# Patient Record
Sex: Male | Born: 1945 | Race: White | Hispanic: No | Marital: Single | Smoking: Never smoker
Health system: Southern US, Community
[De-identification: ages and names within clinical notes are randomized; demographics above are authoritative.]

## PROBLEM LIST (undated history)

## (undated) DIAGNOSIS — M1990 Unspecified osteoarthritis, unspecified site: Secondary | ICD-10-CM

---

## 1999-12-17 ENCOUNTER — Ambulatory Visit (HOSPITAL_BASED_OUTPATIENT_CLINIC_OR_DEPARTMENT_OTHER): Admission: RE | Admit: 1999-12-17 | Discharge: 1999-12-17 | Payer: Self-pay | Admitting: Orthopedic Surgery

## 2000-05-29 ENCOUNTER — Encounter: Payer: Self-pay | Admitting: Neurological Surgery

## 2000-06-02 ENCOUNTER — Inpatient Hospital Stay (HOSPITAL_COMMUNITY): Admission: RE | Admit: 2000-06-02 | Discharge: 2000-06-04 | Payer: Self-pay | Admitting: Neurological Surgery

## 2000-06-02 ENCOUNTER — Encounter: Payer: Self-pay | Admitting: Neurological Surgery

## 2000-06-26 ENCOUNTER — Encounter: Admission: RE | Admit: 2000-06-26 | Discharge: 2000-06-26 | Payer: Self-pay | Admitting: Neurological Surgery

## 2000-06-26 ENCOUNTER — Encounter: Payer: Self-pay | Admitting: Neurological Surgery

## 2000-08-10 ENCOUNTER — Inpatient Hospital Stay (HOSPITAL_COMMUNITY): Admission: RE | Admit: 2000-08-10 | Discharge: 2000-08-13 | Payer: Self-pay | Admitting: Orthopedic Surgery

## 2001-02-03 ENCOUNTER — Ambulatory Visit (HOSPITAL_COMMUNITY): Admission: RE | Admit: 2001-02-03 | Discharge: 2001-02-03 | Payer: Self-pay | Admitting: Neurological Surgery

## 2001-02-03 ENCOUNTER — Encounter: Payer: Self-pay | Admitting: Neurological Surgery

## 2001-07-05 ENCOUNTER — Inpatient Hospital Stay (HOSPITAL_COMMUNITY): Admission: RE | Admit: 2001-07-05 | Discharge: 2001-07-12 | Payer: Self-pay | Admitting: Orthopedic Surgery

## 2001-07-05 ENCOUNTER — Encounter: Payer: Self-pay | Admitting: Orthopedic Surgery

## 2003-04-07 ENCOUNTER — Ambulatory Visit (HOSPITAL_COMMUNITY): Admission: RE | Admit: 2003-04-07 | Discharge: 2003-04-07 | Payer: Self-pay | Admitting: Neurological Surgery

## 2003-05-01 ENCOUNTER — Inpatient Hospital Stay (HOSPITAL_COMMUNITY): Admission: RE | Admit: 2003-05-01 | Discharge: 2003-05-09 | Payer: Self-pay | Admitting: Neurological Surgery

## 2003-05-09 ENCOUNTER — Inpatient Hospital Stay (HOSPITAL_COMMUNITY)
Admission: RE | Admit: 2003-05-09 | Discharge: 2003-05-23 | Payer: Self-pay | Admitting: Physical Medicine & Rehabilitation

## 2003-11-01 ENCOUNTER — Inpatient Hospital Stay (HOSPITAL_COMMUNITY): Admission: EM | Admit: 2003-11-01 | Discharge: 2003-11-08 | Payer: Self-pay | Admitting: *Deleted

## 2003-11-01 ENCOUNTER — Encounter (INDEPENDENT_AMBULATORY_CARE_PROVIDER_SITE_OTHER): Payer: Self-pay | Admitting: Specialist

## 2004-02-28 ENCOUNTER — Ambulatory Visit (HOSPITAL_BASED_OUTPATIENT_CLINIC_OR_DEPARTMENT_OTHER): Admission: RE | Admit: 2004-02-28 | Discharge: 2004-02-28 | Payer: Self-pay | Admitting: Orthopedic Surgery

## 2004-07-01 ENCOUNTER — Inpatient Hospital Stay (HOSPITAL_COMMUNITY): Admission: RE | Admit: 2004-07-01 | Discharge: 2004-07-04 | Payer: Self-pay | Admitting: Orthopedic Surgery

## 2004-10-07 ENCOUNTER — Ambulatory Visit (HOSPITAL_COMMUNITY): Admission: RE | Admit: 2004-10-07 | Discharge: 2004-10-07 | Payer: Self-pay | Admitting: Neurological Surgery

## 2006-04-13 ENCOUNTER — Inpatient Hospital Stay (HOSPITAL_COMMUNITY): Admission: RE | Admit: 2006-04-13 | Discharge: 2006-04-17 | Payer: Self-pay | Admitting: Orthopedic Surgery

## 2006-04-14 ENCOUNTER — Ambulatory Visit: Payer: Self-pay | Admitting: Physical Medicine & Rehabilitation

## 2006-04-27 ENCOUNTER — Ambulatory Visit (HOSPITAL_COMMUNITY): Admission: RE | Admit: 2006-04-27 | Discharge: 2006-04-27 | Payer: Self-pay | Admitting: Endocrinology

## 2006-04-27 ENCOUNTER — Encounter: Payer: Self-pay | Admitting: Vascular Surgery

## 2007-02-10 ENCOUNTER — Ambulatory Visit (HOSPITAL_BASED_OUTPATIENT_CLINIC_OR_DEPARTMENT_OTHER): Admission: RE | Admit: 2007-02-10 | Discharge: 2007-02-10 | Payer: Self-pay | Admitting: Orthopedic Surgery

## 2009-10-11 ENCOUNTER — Encounter: Admission: RE | Admit: 2009-10-11 | Discharge: 2009-10-11 | Payer: Self-pay | Admitting: Otolaryngology

## 2009-10-12 ENCOUNTER — Ambulatory Visit (HOSPITAL_BASED_OUTPATIENT_CLINIC_OR_DEPARTMENT_OTHER): Admission: RE | Admit: 2009-10-12 | Discharge: 2009-10-12 | Payer: Self-pay | Admitting: Ophthalmology

## 2010-07-12 NOTE — Op Note (Signed)
[name], [name]               ACCOUNT NO.:  [id number]  MEDICAL RECORD NO.:  [id number]          PATIENT TYPE:  AMB  LOCATION:  DSC                          FACILITY:  [facility]  PHYSICIAN:  Pasty Spillers. Maple Hudson, M.D. DATE OF BIRTH:  [birth year]  DATE OF PROCEDURE:  10/12/2009 DATE OF DISCHARGE:  10/12/2009                              OPERATIVE REPORT  PREOPERATIVE DIAGNOSES: 1. Esotropia. 2. Amblyopia, left eye. 3. Status post previous eye muscle surgery, age 60, details unknown.  POSTOPERATIVE DIAGNOSES: 1. Esotropia. 2. Amblyopia, left eye. 3. Status post previous eye muscle surgery, age 48, details unknown.  PROCEDURES: 1. Left medial rectus muscle re-recession, 2.5 mm (previously recessed 5     mm), adjustable. 2. Left lateral rectus muscle resection, 8.0 mm.  SURGEON:  Pasty Spillers. Kiora Hallberg, MD  ANESTHESIA:  General (laryngeal mask).  COMPLICATIONS:  None.  DESCRIPTION OF PROCEDURE:  After preop evaluation including informed consent, the patient was taken to the operating room where he was identified by me.  General anesthesia was induced without difficulty after placement of appropriate monitors.  The patient was prepped and draped in standard sterile fashion.  Lid speculum was placed in the left eye.  A limbal conjunctival peritomy was made nasally in the left eye with Westcott scissors, with relaxing incisions in the supranasal and inferonasal quadrants.  The left medial rectus muscle was engaged on a series of muscle hooks and cleared of its surrounding fascial attachments and scar tissue.  The muscle was found inserted approximately 10.5 mm posterior to limbus, suggesting a previous recession of 5.0 mm.  The tendon was secured with a double-arm 6-0 Vicryl suture, double-locking bite at each border of the muscle, 1 mm from the insertion.  The muscle was disinserted.  Each pole suture was passed back into the original insertion, 5.5 mm posterior to limbus,  in crossed-swords fashion.  The muscle was drawn up to the level of the original insertion, and all slack was removed.  The two pole sutures were tied together approximately 10 cm above the sclera.  The pole sutures were then joined together with a needle driver at a measured distance of 7.5 mm above the sclera.  The pole sutures were then joined with noose suture of 6-0 Vicryl.  The needle driver was removed.  The muscle was allowed to hang back until the noose knot reached sclera, creating a 7.5-mm hang-back recession of the medial rectus muscle.  The superior corner of the conjunctival flap was apposed with a 6-0 plain gut suture, attaching it to sclera and to the adjacent conjunctiva just anterior to the superior corner of the medial rectus insertion.  The superior relaxing incision was closed with a second 6-0 plain gut suture.  The inferior corner of the conjunctival flap was joined to adjacent conjunctiva with a large loop of 6-0 plain gut, leaving the flap open to facilitate suture adjustment.   A second conjunctival peritomy was made temporally in the left eye with Westcott scissors, with relaxing incisions in the superotemporal and inferotemporal quadrants.  The left lateral rectus muscle was engaged on a series of muscle hooks and  cleared of its fascial attachments.  The muscle was spread between two self-retaining hooks.  A 2-mm bite was taken off the center of the muscle belly at a measured distance of 8.0 mm posterior to the insertion, knot was tied securely at this location.  The needle at each end of the double-arm suture was passed from the center of the muscle belly to periphery, parallel to and 8.0 mm posterior to the insertion.  A double-locking bite was placed at each border of the muscle.  A resection clamp was placed on the muscle just anterior to these sutures.  The muscle was disinserted.  Each pole suture was passed posteriorly to anteriorly through the  corresponding end of the muscle stump, then anteriorly to posteriorly near the center of the stump, then posteriorly to anteriorly through the center of the muscle belly, just posterior to the previously placed knot.  The muscle was drawn up the level of the original insertion, and all slack was removed before the suture ends were tied securely.  The clamp was removed.  The portion of the muscle anterior to the sutures was carefully excised.  Conjunctiva was reapposed with multiple 6-0 plain gut sutures after resecting the anterior 2 mm of the conjunctival flap.  The flap was left recessed 1-2 mm posterior to the limbus.  A traction suture of 6-0 silk was placed at the nasal limbus.  The pole, noose, and traction sutures were taped to the left cheek with a Steri-Strip.  TobraDex ointment was placed in the eye.  A soft patch was placed over the eye.  The patient was awakened without difficulty and taken to the recovery room in stable condition, having suffered no intraoperative or immediate postoperative complications.     Pasty Spillers. Maple Hudson, M.D.    Cheron Schaumann  D:  01/03/2010  T:  01/03/2010  Job:  [id number]  Electronically Signed by Verne Carrow M.D. on 07/12/2010 09:49:33 AM

## 2010-08-05 LAB — POCT HEMOGLOBIN-HEMACUE: Hemoglobin: 15.2 g/dL (ref 13.0–17.0)

## 2010-09-20 ENCOUNTER — Other Ambulatory Visit (HOSPITAL_COMMUNITY): Payer: Self-pay | Admitting: Orthopedic Surgery

## 2010-09-20 DIAGNOSIS — T84038A Mechanical loosening of other internal prosthetic joint, initial encounter: Secondary | ICD-10-CM

## 2010-09-20 DIAGNOSIS — M25562 Pain in left knee: Secondary | ICD-10-CM

## 2010-10-01 NOTE — Op Note (Signed)
NAMEMILT, COYE               ACCOUNT NO.:  [id number]   MEDICAL RECORD NO.:  [id number]          PATIENT TYPE:  AMB   LOCATION:  DSC                          FACILITY:  [facility]   PHYSICIAN:  Feliberto Gottron. Turner Daniels, M.D.   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   PROCEDURE:  Left iliotibial band insertion release.   SURGEON:  Feliberto Gottron. Turner Daniels, M.D.   ASSISTANT:  None.   ANESTHESIA:  General LMA.   ESTIMATED BLOOD LOSS:  Minimal.   FLUID REPLACEMENT:  500 mL crystalloid.   TOURNIQUET TIME:  None.   INDICATIONS FOR PROCEDURE:  65 year old man has had bilateral shoulder  replacements and knee replacements done by me over the years. The left  knee has had some chronic IT band tendinitis along the insertion near  Gertie's tubercle.  This responded very well to cortisone injections x 2-  3, but they only last for a few weeks and then the pain returns. Because  of the chronic nature of the pain, its recurrence, and the fact that it  prevents his ability to do certain chores around the house, he desires  elective left IT band release.  He is well aware of the risks and  benefits of surgery which were discussed preoperatively.   DESCRIPTION OF PROCEDURE:  The patient was identified by armband and  taken to the operating room at [facility].  Appropriate  anesthetic monitors were attached and general LMA anesthesia induced  with the patient in the supine position.  A tourniquet was applied high  to the left thigh but never used.  The left lower extremity was prepped  and draped in the usual sterile fashion from the mid  calf to the thigh  and we began the procedure by making a longitudinal incision over the IT  band insertion through the skin and subcutaneous tissue.  We then  carefully identified the IT band insertion on the lateral epicondyle of  the femur and this was incised using the electrocautery, going from  anterior to posterior and being very careful to avoid the peroneal nerve  as we went posteriorly.  Again, small bleeders were identified and  cauterized.  Once the IT band was fully released, the wound was  irrigated out with normal saline solution and the subcutaneous tissue  closed with running 2-0 Vicryl suture and  the skin with running interlocking 3-0 nylon suture.  A dressing of  Xeroform, 4x4 dressing sponges, Webril and Ace wrap applied.  Local  anesthetic of 0.5% Marcaine was infiltrated into the area prior to  placement of the dressing.  The patient was then awakened and taken to  the recovery room without difficulty.      Feliberto Gottron. Turner Daniels, M.D.  Electronically Signed  FJR/MEDQ  D:  02/10/2007  T:  02/10/2007  Job:  [id number]

## 2010-10-02 ENCOUNTER — Ambulatory Visit (HOSPITAL_COMMUNITY)
Admission: RE | Admit: 2010-10-02 | Discharge: 2010-10-02 | Disposition: A | Discharge status: Home / Self Care | Payer: MEDICARE | Source: Ambulatory Visit | Attending: Orthopedic Surgery | Admitting: Orthopedic Surgery

## 2010-10-02 ENCOUNTER — Encounter (HOSPITAL_COMMUNITY)
Admission: RE | Admit: 2010-10-02 | Discharge: 2010-10-02 | Disposition: A | Discharge status: Home / Self Care | Payer: MEDICARE | Source: Ambulatory Visit | Attending: Orthopedic Surgery | Admitting: Orthopedic Surgery

## 2010-10-02 ENCOUNTER — Encounter (HOSPITAL_COMMUNITY): Payer: Self-pay

## 2010-10-02 DIAGNOSIS — T84038A Mechanical loosening of other internal prosthetic joint, initial encounter: Secondary | ICD-10-CM

## 2010-10-02 DIAGNOSIS — Z96659 Presence of unspecified artificial knee joint: Secondary | ICD-10-CM | POA: Insufficient documentation

## 2010-10-02 DIAGNOSIS — M25562 Pain in left knee: Secondary | ICD-10-CM

## 2010-10-02 DIAGNOSIS — M25569 Pain in unspecified knee: Secondary | ICD-10-CM | POA: Insufficient documentation

## 2010-10-02 MED ORDER — TECHNETIUM TC 99M MEDRONATE IV KIT
23.5000 | PACK | Freq: Once | INTRAVENOUS | Status: AC | PRN
  Administered 2010-10-02: 24 via INTRAVENOUS

## 2010-10-04 NOTE — Op Note (Signed)
[place]  Patient:    [name], [name] Visit Number: [id number] MRN: [id number]          Service Type: SUR Location: [id number] Attending Physician:  Alinda Deem Dictated by:   Alinda Deem, M.D. Proc. Date: 07/05/01 Admit Date:  07/05/2001                             Operative Report  PREOPERATIVE DIAGNOSIS:  Degenerative arthritis of the right knee with varus deformity and a 15 degree flexion contracture.  POSTOPERATIVE DIAGNOSIS:  Degenerative arthritis of the right knee with varus deformity and a 15 degree flexion contracture.  OPERATION PERFORMED:  Right total knee arthroplasty using Osteonics Scorpio components, all cemented, a #9 right femur, 11 tibial base plate. 12 mm PS spacer, #11 modified medial patellar button.  SURGEON:  Alinda Deem, M.D.  ASSISTANT:  Dorthula Matas, P.A.-C.  ANESTHESIA:  General endotracheal.  ESTIMATED BLOOD LOSS:  Minimal.  FLUID REPLACEMENT:  1500 cc of crystalloid.  DRAINS:  Two medium Hemovacs.  TOURNIQUET TIME:  One hour 25 minutes.  INDICATIONS FOR PROCEDURE:  The patient is a 65 year old man with end-stage arthritis of the medial compartment of the right knee with significant varus deformity of 5 to 10 degrees, bone-on-bone arthritic changes, peripheral osteophytes and a 15 degree flexion contracture.  I did a shoulder replacement on him a few years ago.  He has done reasonably well.  He now desires right total knee arthroplasty to decrease pain and increase function having failed conservative measures.  DESCRIPTION OF PROCEDURE:  The patient was identified by arm band and taken to the operating room at [facility] main hospital where the appropriate anesthetic monitors were attached and general endotracheal anesthesia induced with the patient in the supine position.  The tourniquet was applied high to the right thigh.  The right lower extremity was then fixed with a  foot positioner in the lateral post.  The right lower extremity was prepped and draped in the usual sterile fashion from the ankle to the tourniquet high on the thigh.  The limb was wrapped with an Esmarch bandage.  Tourniquet inflated to 350 mHg with the knee flexed and we began the procedure by making an anterior midline incision 15 cm in length starting a handbreadth above the patella and going medial to and just distal to the tibial tubercle small bleeders in the skin and subcutaneous tissues identified and cauterized with the electrocautery.  We cut down to the transverse retinaculum which was incised and reflected medially allowing a medial parapatellar arthrotomy and the patella was everted.  The prepatellar fat pad and the anterior one half of the medial and lateral menisci were then resected without difficulty and the knee was then hyperflexed exposing the cruciate ligaments and the tibial spines.  The tibial spines were removed with a power saw.  The cruciate ligaments were removed with the electrocautery and we then set about removing the posterior one half of the menisci as well.  The proximal tibia was then instrumented with the osteonics step drill followed by an intramedullary rod and a 0 degree sloped proximal tibial cutting guide was pinned into place allowing a thin wafer of bone medially and 7 to 8 mm of bone laterally.  The proximal tibial cut was then performed with a power saw protecting the posterior structures with a McHale retractor in the notch, a posteromedial Z-retractor  and a posterolateral Hohmann retractor.  Satisfied with proximal tibial resection, we then entered the distal femur with the osteonics step drill 2 mm anterior to the PCL origin followed by the intramedullary rod with a 5 degree right, 10 mm distal femoral cutting guide.  The distal femoral cut was then accomplished after pinning the cutting guide in place to be sized for a #9 femoral component.   We pinned the cutting guide in place with 3 degrees of external rotation to allow for the medial tight flexion gap and performed our anterior, posterior and Chamfer cuts without difficulty followed by the [employer] box cut.  We also removed osteophytes from a posterior superior aspect of the femoral condyles.  The everted patella was grasped with a patellar cutting guide and the posterior 9 to 10 mm of the patella resected.  We sized for a #11 modified medial patellar button and drilled the patella.  A trial #9 right femoral component was then hammered into place with an 11 tibial base plate and a 10 and 12 spacer were tried.  The 12 space had full extension, full flexion and good ligamentous stability and was selected.  We marked the correct rotation of the tibial base plate and the trial components were then removed.  The base plate was once again replaced with the knee hyperflexed and the patella everted and the Delta fit keel punch was accomplished with the plate pinned in the correct rotation.  All bony surfaces were then water picked clean, dried with suction and sponges.  A double batch of [employer] Simplex polymethyl methacrylate cement with 1500 mg of Zinacef was then mixed and applied to all bony and metallic surfaces.  In order, we hammered into place a #11 tibial base plate, a 9 right femoral component, a #11 modified medial patellar button and the excess cement was removed.  The knee was once again reduced after the cement had cured and taken through a range of motion, came to full extension, flexion was 130 degrees and ligament stability was excellent.  The varus deformity was now gone.  Medium hemovacs were then placed deep in the wound which was once again waterpicked clean.  We checked one more time for any loose bits of cement and then closed the parapatellar arthrotomy with running #1 Vicryl suture, the subcutaneous tissue with 0 and 2-0 undyed Vicryl sutures and  the skin with skin staples.  A dressing of Xeroform, 4 x 4 dressing sponges, Webril and an Ace wrap applied.  The patient was awakened and taken to the recovery room without difficulty.  Dictated by:   Alinda Deem, M.D. Attending Physician:  Alinda Deem DD:  07/05/01 TD:  07/05/01 Job: [id number]

## 2010-10-04 NOTE — Op Note (Signed)
NAMEELAM, ELLIS               ACCOUNT NO.:  [id number]   MEDICAL RECORD NO.:  [id number]          PATIENT TYPE:  AMB   LOCATION:  DSC                          FACILITY:  [facility]   PHYSICIAN:  Feliberto Gottron. Turner Daniels, M.D.   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   OPERATION PERFORMED:  Left shoulder arthroscopic anterior inferior  acromioplasty.  Removal of distal clavicular spur.  Debridement of massive  rotator cuff tear as well as smaller labral tears.  We also did the greater  tuberoplasty.   SURGEON:  Feliberto Gottron. Turner Daniels, M.D.   ASSISTANTLaural Benes. Jannet Mantis.   ANESTHESIA:  Interscalene block plus general endotracheal.   ESTIMATED BLOOD LOSS:  Minimal.   FLUIDS REPLACED:  800 mL of crystalloid.   DRAINS:  None.   TOURNIQUET TIME:  None.   INDICATIONS FOR PROCEDURE:  The patient is a 65 year old gentleman with  significant left shoulder impingement syndrome who has failed conservative  treatment with anti-inflammatory medicines, cortisone injections and has now  got significant pain on the right side.  I believe he had an attempt at a  rotator cuff repair that failed.  He had debridement of a massive tear later  on and has functioned fairly well despite an extensive open procedure on the  other side that ultimately led to, I believe, a hemiarthroplasty on the  right shoulder.   DESCRIPTION OF PROCEDURE:  The patient was identified by arm band and taken  to the operating room at [facility] where appropriate  anesthetic monitors are attached and interscalene block anesthesia induced  into the left upper extremity followed by general endotracheal anesthesia.  He  was placed in the beach chair position and the left upper extremity  prepped and draped in the usual sterile fashion from the wrist to the  hemithorax.  Using a #11 blade, standard portals were made 1.5 cm anterior  to the acromioclavicular joint lateral to the junction of the middle and  posterior thirds of the acromion, posterior to the posterolateral corner of  the acromion process.  The inflow was placed anteriorly to gravity, the  arthroscope laterally and a 4.2 Great White sucker shaver posteriorly.  We  immediately encountered large subclavicular and subacromial spurs which were  cleaned with the 4.2 [place] sucker shaver.  Small bleeders were  cauterized with the underwater electrocautery and the spurs were then  removed with a 4.5 hooded Vortex bur.  The patient had obvious large massive  rotator cuff tear retracted to the glenoid rim.  This was debrided back to a  stable margin especially along the infraspinatus insertion and again, it was  all the way back to the musculotendinous junction.  There was no usable  tissue and age 73 or at any age for that matter,  rotator cuff repair could  not be attempted.  Some spurs in the greater tuberosity were smoothed off  with a 4.5 hooded Vortex bur.  Small degenerative tears in the labrum were  resected.  The shoulder was washed out with normal saline solution and the  arthroscopic instruments removed.  A dressing of Xeroform, 4 x 4 dressing  sponges and paper tape applied with a sling.  Patient was then laid supine,  awakened and taken to the recovery room without difficulty.      Emilio Aspen  D:  02/28/2004  T:  02/28/2004  Job:  [id number]

## 2010-10-04 NOTE — Op Note (Signed)
[name], ROSSA               ACCOUNT NO.:  [id number]   MEDICAL RECORD NO.:  [id number]          PATIENT TYPE:  INP   LOCATION:  5039                         FACILITY:  [facility]   PHYSICIAN:  Feliberto Gottron. Turner Daniels, M.D.   DATE OF BIRTH:  March 28, 1946   DATE OF PROCEDURE:  04/13/2006  DATE OF DISCHARGE:                               OPERATIVE REPORT   PREOPERATIVE DIAGNOSIS:  End-stage arthritis of the left knee.   POSTOPERATIVE DIAGNOSIS:  End-stage arthritis of the left knee.   PROCEDURE:  Left total knee arthroplasty using Osteonics Scorpio  components, 11 tibia, 9 femur, 10-mm PS spacer and a #9 patellar button.  All components cemented with a double batch of Howmedica  polymethylmethacrylate cement with 1500 mg Zinacef in the cement.   SURGEON:  Feliberto Gottron. Turner Daniels, M.D.   FIRST ASSISTANT:  Skip Mayer, PA-C.   ANESTHETIC:  General endotracheal.   ESTIMATED BLOOD LOSS:  Minimal.   FLUID REPLACEMENT:  1 L crystalloid.   DRAINS PLACED:  Foley catheter.   URINE OUTPUT:  400 mL urine.   TOURNIQUET TIME:  One hour 20 minutes.   INDICATIONS FOR PROCEDURE:  Patient is a 65 year old gentleman who had a  previous right total knee done by me as well as a couple of hemi  shoulder arthroplasties done by me and has done well with these  procedures.  He has end-stage arthritis of his left knee and now desires  left total knee arthroplasty.  Risks and benefits of surgery are well  known to the patient and questions were answered prior to the procedure.   DESCRIPTION OF PROCEDURE:  The patient was identified by arm band and  taken to the operating room at [facility] where the appropriate  anesthetic, monitors were attached and general endotracheal anesthesia  induced with the patient in the supine position.  A tourniquet was  applied high to the left lower extremity.  A Foley catheter inserted.  Foot positioned in lateral post applied to the table and then the left  lower  extremity was prepped and draped in the usual, sterile fashion  from the ankle to the tourniquet.  The limb was wrapped with an Esmarch  bandage and the tourniquet inflated to 350 mmHg with __________ , and we  began the procedure by making a standard anterior midline incision about  18 cm length, starting a handbreadth above the patella, going over the  patella and just distal to and medial to the tibial tubercle by about 1  cm.  We cut down to the level of the transverse retinaculum over the  patella.  This was reflected medially allowing a medial parapatellar  arthrotomy.  Small bleeders were identified and cauterized with the  underwater electrocautery.  The patella was everted.  The prepatellar  fat pad resected.  The superficial medial collateral ligament was  elevated off the tibia from anterior to posterior, leaving it intact  distally, allowing [place] to rotate the tibia out into external rotation  from beneath the chamber.  At this point, with the patella everted, the  knee was hyperflexed  and the anterior one-half of the menisci were  removed.  The cruciate ligaments were also removed and osteophytes,  which were quite exuberant, were removed from the notch and from the  distal femur.  A McGill retractor was placed through the notch, bringing  the tibia forward as was a posteromedial Z retractor and a lateral  Holman retractor on the proximal tibia.  The proximal tibia was then  entered with the Osteonics step drill followed by the intramedullary  guide and a 0-degree posterior slope cutting guide which allowed  resection of about 5 mm of bone medially and 10 mm of bone and cartilage  laterally.  He did have a varus deformity.   Satisfied with the bony resection, we then directed our attention to the  distal femur and entered it with a step drill 10 mm anterior to the PCL  origin.  We measured for a non distal femoral component and then fixed  the distal femoral cutting guide to  the intramedullary rod set for 5  degrees left.  A 10-mm distal femoral cut was accomplished.  We resized  again for a non femoral component and hammered cutting blocks in place  with 3 degrees of external rotation.  The anterior, posterior and  chamfer cuts were then accomplished without difficulty followed by the  Scorpio anterior trochlear cut and the box cut.   We then directed our attention to the everted patella, which was grasped  with a patellar cutting guide and the posterior 10 mm of the patella  resected, sized to a #9 patellar component and drilled.  At this point a  9 left trial femoral component was hammered into place.  An 11 tibial  baseplate with a trial 10 mm spacer was then placed on the tibia and a 9  trial patellar button placed on the patella.  At this point the knee was  reduced and taken through a range of motion.  It came to full extension  with slight hyperextension, which is good, because he had a 15-degree  flexion contracture and good patellar tracking.  At this point the trial  components were removed after setting the rotation of the tibia  baseplate along the second metatarsal ray.   Trial components were removed.  The tibial baseplate was then pinned  into place in the correct rotation along the second metatarsal ray and  the Delta keyhole punch was brought up going sequentially up to a 9.11  PressFit punch followed by a 9.11 cemented punch.  All bony services  were then cleaned with Water-Pik and dried with suction and sponges.  At  the back table a double batch of Howmedica Simplex  polymethylmethacrylate cement was mixed with 1500 mg Zinacef applied to  all bony and metallic __________  surfaces and in order we hammered into  place an 11 tibial baseplate and removed excess cement and a 9 left  femoral component, removed excess cement.  A 10-mm posterior stabilized  spacer was then snapped into the tibial tray and the knee reduced.  We then pressed the  9 modified medial patellar button onto the patella and  again removed excess cement.  After the cement had cured, the knee was  reduced and taken through a range of motion and good stability was  noted.   At this point medium Hemovac drains were placed deep in the wound.  The  parapatellar arthrotomy was closed with running, #1 Vicryl suture.  The  subcutaneous tissue with 0 and  2-0 undyed Vicryl suture and the skin  with skin staples.  It was dressed with Xeroform, 4 x 4 dressing,  sponges, Webril and ace wrap applied.  The tourniquet was let down.  The  patient was then awakened and taken to the recovery room without  difficulty.      Feliberto Gottron. Turner Daniels, M.D.  Electronically Signed     FJR/MEDQ  D:  04/13/2006  T:  04/14/2006  Job:  [id number]

## 2010-10-04 NOTE — Op Note (Signed)
NAMESUSANO, CLECKLER                         ACCOUNT NO.:  [id number]   MEDICAL RECORD NO.:  [id number]                   PATIENT TYPE:  INP   LOCATION:  3102                                 FACILITY:  [facility]   PHYSICIAN:  Stefani Dama, M.D.               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  Degenerative spondylolisthesis.   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  Degenerative spondylolisthesis.   OPERATION PERFORMED:  L3, L4 and L5 laminectomy, posterior lumbar interbody  fusion L3-4 with PEAK cages and autograft segmental pedicular fixation, L3,  L4 and L5 with bone screws, posterolateral arthrodesis with autograft and  allograft, Symphony platelet gel.   SURGEON:  Stefani Dama, M.D.   ASSISTANT:  Hilda Lias, M.D.   ANESTHESIA:  General endotracheal.   INDICATIONS FOR PROCEDURE:  The patient is a 65 year old individual who has  had significant back and bilateral pain.  He has been progressively more  immobilized because of significant pain and symptoms of neurogenic  claudication in addition to chronic radicular pain.  He has been advised  regarding surgical decompression of his lumbar spine in addition to  arthrodesis from L3 to the sacrum.   DESCRIPTION OF PROCEDURE:  The patient was brought to the operating room  supine on the stretcher.  After smooth induction of general endotracheal  anesthesia, he was turned prone, the back was shaved, prepped with DuraPrep  and draped in sterile fashion.  An elliptical incision was made around a  previous scar low on his back and this was excised.  The incision was then  carried cephalad, dissection was carried through the lumbodorsal fascia to  expose the first several spinous processes  which were identified as L5 and  L4.  Dissection was then carried out to the posterolateral space in a  subperiosteal fashion, packing the wound out laterally.  The patient is very  large in stature and has required the use of the Codman lumbar spine  retractor and the dissection was carried down to the area of the sacrum.  The spinous processes were then each sequentially isolated.  There was noted  to be some gross motion between L3 and L4 and again between L4 and L5;  however, at L5-S1 there was noted to be no motion whatsoever.  Despite  multiple attempts at tugging, it was felt that there was a solid arthrodesis  between L5 and the sacrum.  The dissection was then carried out over the  facet joints and into the intertransverse space so that the transverse  processes could be packed off from L3 down to L5 on each side.  Lateral  gutters were then packed off even down to the ala.  This was particularly  done on the patient's left side.  Laminectomy was then performed removing  laminar arch of L4 completely, L3 completely, undercutting L5 significantly  so as to allow for good decompression of the L5 nerve roots as they entered  their exit foramina.  Once this was performed, the central canal was  decompressed.  Significant bleeding was encountered from the epidural spaces  and this required careful cautery and packings and slowed the procedure with  significant blood loss being experienced from opening dissection to this  point.  Once however, the disk spaces were isolated, the bleeding was more  under control, diskectomies were then performed at the L3-4 and the L4-5  space using bilateral approach.  Pedicle entry sites were then secured at  L3, L4 and L5.  These were checked sequentially on radiographs first by  having probes on the left side and on both sides with some adjustment of the  probes being made for assuring centralization in the pedicle.  Then the  pedicles were sounded and  then tapped with 6.5 mm taps at L5 and L4 and 7 x  45 mm screws being placed in L4 and L5 and 6.5 x 45 mm screws being placed  at L3.  Care was taken to make sure that there was no cut off particularly  in the medial and inferior aspects of the holes which were previously tapped  and then replaced with the screws.  Then complete diskectomies were  completed using combination of diskectomy tools and rongeurs and the end  plates were then prepared for PEAK cage implantation with the patient's own  autograft which was mixed with symphony platelet gel to fortify the  arthrodesis.  9 mm PEAK cages were chosen for the L3-4 and the L4-5  interspaces and after the disk spaces were completely prepared with both  radical curettage both medially and laterally and inferiorly to make sure  that all the disk material was removed.  They were packed with the patient's  bone and platelet gel and then ___________ two channels were made for the  PLIF bone spacers which were from [employer] made of PEAK.  These were first  packed into L3-L4 and then L4 and L5 were packed.  Once these cages were  placed, the rest of the interspace was filled with the patient 's autologous  bone graft which was mixed with the platelet gel.  Attention was then turned  to the posterolateral gutters.  With the screws being placed, the  posterolateral gutters were packed from the intertransverse space at L5 all  the way up to L3.  Reamer was used over the screw caps to allow placement of  the screw caps and allow enough toggle so that screw caps could be aligned.  65 mm precontoured rods were then placed in each of the gutters and secured.  Screws were then loosened slightly to allow for placement in compression.  This was performed simultaneously on both sides and then final localizing  radiograph identified good overall alignment in the sagittal plane.  The  remainder of the bone graft was packed in the posterolateral gutters.   The central area of the wound was irrigated copiously.  Care was taken and it  was assured that no CSF leaks were encountered during this procedure.  The  L3 nerve roots superiorly and the L4 and the L5 nerve roots were each all  well decompressed out to the lateral foramen.  With this then, hemostasis  was achieved in the soft tissues.  Then the lumbodorsal fascia was brought  to closure over a large Hemovac drain which was left centrally.  This was  brought out through a separate stab incision.  The wound was closed with #1  Vicryl in the fascia and 2-0 Vicryl subcutaneously and 3-0 Vicryl  subcuticularly.  The estimated blood loss was about 3300 mL.  Cell Saver  blood was returned to the patient.                                               Stefani Dama, M.D.    [name]  D:  05/01/2003  T:  05/02/2003  Job:  [id number]

## 2010-10-04 NOTE — Discharge Summary (Signed)
[name], [name]                         ACCOUNT NO.:  [id number]   MEDICAL RECORD NO.:  [id number]                   PATIENT TYPE:  IPS   LOCATION:  4145                                 FACILITY:  [facility]   PHYSICIAN:  Ranelle Oyster, M.D.             DATE OF BIRTH:  20-May-1945   DATE OF ADMISSION:  05/09/2003  DATE OF DISCHARGE:  05/23/2003                                 DISCHARGE SUMMARY   DISCHARGE DIAGNOSES:  1. Posterior lumbar interbody fusion of L4-L5 with graft.  2. Left hip pain secondary to radiculopathy.  3. History of left lower extremity sciatica.  4. Postoperative anemia.   HISTORY OF PRESENT ILLNESS:  [name] is a 65 year old male with history of  DJD and DDD with cervical myelopathy, progressive low back pain with  radiculopathy bilateral lower extremities secondary to stenosis L4-L5  and  L3-L4 x 1 with  spondylosis.  He elected to undergo L3-L4 laminectomy with  PLIF on May 01, 2003.  Postoperatively was transfused 2 units of packed  red blood cells due to drop in hemoglobin and hematocrit.  He also developed  left hip pain, question neuropathy versus bursitis.  MRI left hip done  showed no degenerative changes.  CT spine showed alignment  within normal  limits.  PT and OT initiated, and patient moderate assistance for transfers,  minimum assistance ambulating 20 feet with pain being limiting factor.   PAST MEDICAL HISTORY:  1. Right total knee replacement.  2. Right shoulder repair in 2002.  3. Limited range of motion right hand surgery.  4. Low back pain.  5. Low back surgery.  6. Bilateral knee scope.  7. Sciatica, right hip status post steroids.  8. Insomnia.  9. History of MI.   ALLERGIES:  No known drug allergies   SOCIAL HISTORY:  The patient lives alone in one-level apartment with two  steps at entry, was independent with cane prior to admission.  Question  family support.   HOSPITAL COURSE:  Mr. [name] was admitted to rehab on  May 09, 2003, or  inpatient therapy to consist of PT and OT daily.  Past admission labs  checked showing postop anemia to be much improved with hemoglobin 10.9,  hematocrit 31.4, white count 9.8.  Sodium 139, potassium 4.0, chloride 96,  BUN 12, creatinine 0.9, glucose 109.  The patient has had some complaints of  left lower extremity pain which was improved with the use of Neurontin which  helped greatly with pain control.  The patient's back incision has healed  well without any signs or symptoms of compression or drainage, and no  erythema noted.  Has had some episodes of constipation that resolved with  use of p.r.n. laxatives.   During his stay in rehab, Mr. Cubero progressed to being modified independent  for transfers, moderate assistance ambulating 75 feet with rolling walker,  able to navigate 8 stairs with supervision.  The patient was modified  independent for upper and lower body care including toileting.  Pain control  was improved by time of discharge.  On May 23, 2003, the patient is  discharged to home.   DISCHARGE MEDICATIONS:  1. Ibuprofen 200 mg t.i.d.  2. Neurontin 400 mg q.h.s.  3. Oxycodone (IR) 5 or 10 mg q.4-6h. p.r.n. pain.  4. Pepcid 20 mg b.i.d.  5. OxyContin CR 20 mg b.i.d. x 1 week, 10 mg per day x 1 week, then     discontinue.   ACTIVITY:  As tolerated with back precautions.   DIET:  Regular.   WOUND CARE:  Keep area clean and dry.   SPECIAL INSTRUCTIONS:  No alcohol, no smoking, no driving.   FOLLOW UP:  The patient is to follow up with Dr. Danielle Dess for postop check in  the next two weeks.  Follow up with primary MD for routine care.  Follow  with Dr. Riley Kill as needed.      Greg Cutter, P.A.                    Ranelle Oyster, M.D.    PP/MEDQ  D:  05/24/2003  T:  05/25/2003  Job:  [id number]   cc:   Talmadge Coventry, M.D.  [address]  [place]  [place] [address]  Fax: [phone]   Stefani Dama, M.D.  [address]  [place]  [place] [address]  Fax: [phone]

## 2010-10-04 NOTE — Discharge Summary (Signed)
[place]  Patient:    [name], [name] Visit Number: [id number] MRN: [id number]          Service Type: SUR Location: [id number] Attending Physician:  Alinda Deem Dictated by:   Dorthula Matas, P.A.-C. Admit Date:  07/05/2001 Discharge Date: 07/12/2001                             Discharge Summary  DISCHARGE DIAGNOSES: 1. End-stage degenerative joint disease of the right knee. 2. History of right shoulder hemiarthroplasty.  PROCEDURE:  Right total knee arthroplasty.  HISTORY OF PRESENT ILLNESS:  The patient is a 65 year old male with end-stage arthritis in the medial compartment of the right knee with significant varus deformity at 5-10 degrees with bone on bone arthritic changes, peripheral osteophytes and a 15-degree flexion contracture.  The patient underwent a shoulder replacement within the last year to two, and he has done fairly well. He now desires right total knee arthroplasty due to increased pain and increased function having failed conservative measures understanding that physical therapy may be hampered slightly by the weakness in his right upper extremity.  MEDICATIONS: 1. Allegra q.d. 2. Bextra 20 mg q.d.  ALLERGIES:  No known drug allergies.  PAST MEDICAL HISTORY: 1. Usual childhood diseases. 2. Arthritis. 3. Degenerative joint disease.  PAST SURGICAL HISTORY: 1. Right knee arthroscopies in 1979 and 1980. 2. C3-C4 fusion in 1972. 3. Right shoulder hemiarthroplasty in 2002, with no difficulties.  SOCIAL HISTORY:  No tobacco, no ethanol, no IV drug abuse.  He is divorced and lives alone.  FAMILY HISTORY:  His mother died at 7.  Father died at 77 due to cancer.  REVIEW OF SYSTEMS:  Positive for glasses, easy bleeding and upper dentures. Denies shortness of breath or chest pain.  PHYSICAL EXAMINATION:  VITAL SIGNS:  The patient is afebrile, pulse 70, respiratory rate 16, blood pressure 150/98.   He is 5 feet 10 inches, 265 pounds.  HEENT:  Normocephalic, atraumatic.  TMs clear.  Pupils are equal, round and reactive to light and accommodation.  Nose is benign.  Throat is clear.  NECK:  Decreased range of motion secondary to effusion, no other pain noted.  CHEST:  Clear to auscultation.  HEART:  Regular rate and rhythm.  ABDOMEN:  Soft, nontender.  Bowel sounds 2+.  EXTREMITIES:  Right knee range of motion is 10-120 degrees with 5-10 degrees of varus deformity and neurovascularly intact.  Well-healed normal scar.  LABORATORY DATA AND X-RAY FINDINGS:  X-rays show bone on bone changes in the medial compartment with changes also in the patella femoral compartment as well.  Preoperative labs including CBC, CMET, chest x-ray, EKG, PT and PTT were all within normal limits with the exception of the chest x-ray which showed borderline cardiomegaly with no active or acute disease process and ALT of 50.  HOSPITAL COURSE:  On the day of admission, the patient was taken to the operating room at [facility] where he underwent a right total knee arthroplasty using Osteonic Scorpio components, well-cemented #9 femur, #11 tibia, base plate with a 12 mm PS spacer, #11 modified medial patellar button. Hemovac drain was placed double armed.  The patient was placed on perioperative antibiotics for the first 48 hours.  He was placed on postoperative Coumadin prophylaxis with a target INR of 1.5-2.  He was placed on PCA for pain control.  Postoperatively, physical therapy was begun  in the postoperative area.  On postop day #1, the patient was awake and alert with no nausea or vomiting. He could sit up to void.  T-max was 100.2.  Hemovac output was 100 cc. Hemoglobin was 11.9.  He was neurovascularly intact and otherwise stable.  He continued early physical therapy.  PT performed evaluation and felt that the patient would be a slow progression in physical therapy and felt that  the patient would benefit from inpatient rehabilitation.  On postop day #2, the patient was without complaint.  He was afebrile. Hemoglobin was 11.4 and INR 1.7.  Dressing was dry and Hemovac was discontinued without difficulty.  The calf remained soft and nontender.  The patient had been evaluated by rehabilitation and they felt he would be appropriate for inpatient admission.  He was okay for this and the bed became available.  PCA was discontinued and he continued using the Foley. Unfortunately, it became clear that the patients insurance would be difficult to obtain any type of approval for a rehabilitation stay, so physical and occupational therapy was continued while in the hospital.  On postop day #3, the patient was without complaint.  He was afebrile. Hemoglobin was 11.4 with INR of 2.2.  His wound edges remained benign.  He continued to tolerate CPM and make slow, but steady progress in physical therapy hampered somewhat by the weakness in his upper extremity as far as making rapid progress in his mobility.  Foley was discontinued on that day.  On postop day #4, the patient was awake and alert.  He had walked some 30-40 feet and was eating well, but was still not independent in action.  Vital signs were stable and wound was clean and dry.  He continued to make slow, but steady progress in PT.  On postop day #5, the patient was without complaint with hemoglobin 11.7 and INR 2.4.  The wound remained benign.  He was not yet independent in transfers during physical therapy and was still working on meeting PT goals.  On postop day #6, he was unchanged with an INR of 2.6 and hemoglobin 11.9.  He continued to work, but making slow and steady progress in PT as well as occupational therapy.  On postop day #7, the patient had met physical therapy goals as well as occupational therapy goals and was transferring independently and needed only minimal assist for other activities.  He  was without complaint.  He was  afebrile with vital signs stable.  Hemoglobin was 11.9, INR 3.0.  Dressing was dry with wound benign and he was otherwise medically stable and improved.  He was discharged home to the care of his family and friends.  He will return to clinic in one weeks time with Dr. Turner Daniels or sooner should he have any increase in temperature, pain or drainage.  DISCHARGE MEDICATIONS: 1. Tylox and Coumadin per protocol. 2. Resume home medications.  ACTIVITY:  Weightbearing as tolerated with walker.  SPECIAL INSTRUCTIONS:  Home health PT including CPM.  Home R.N. for blood draws.  DIET:  Regular.    Dictated by:   Dorthula Matas, P.A.-C. Attending Physician:  Alinda Deem DD:  07/26/01 TD:  07/28/01 Job: [id number]

## 2010-10-04 NOTE — Op Note (Signed)
NAMELEOBARDO, GRANLUND                         ACCOUNT NO.:  [id number]   MEDICAL RECORD NO.:  [id number]                   PATIENT TYPE:  INP   LOCATION:  5733                                 FACILITY:  [facility]   PHYSICIAN:  Vikki Ports, M.D.         DATE OF BIRTH:  1946-02-16   DATE OF PROCEDURE:  11/02/2003  DATE OF DISCHARGE:                                 OPERATIVE REPORT   PREOPERATIVE DIAGNOSIS:  Acute cholecystitis.   POSTOPERATIVE DIAGNOSIS:  Gangrenous cholecystitis.   PROCEDURE:  Laparoscopic cholecystectomy.   SURGEON:  Vikki Ports, M.D.   ASSISTANT:  Magnus Ivan, R.N.F.A.   DESCRIPTION OF PROCEDURE:  The patient was taken to the operating room and  placed in the supine position.  After adequate general anesthesia was  induced using an endotracheal tube, the abdomen was prepped and draped in  the normal sterile fashion.  Using a supraumbilical vertical small incision,  I dissected down to the fascia.  Fascia was opened vertically.  On entering  the peritoneum, an 0 Vicryl pursestring suture was placed around the fascial  defect.  Hasson trocar was placed in the abdomen and the abdomen was  insufflated with continuous-flow carbon dioxide. Under direct visualization  a 10 mm port was placed in the subxiphoid position.  It was obvious that  there was significant inflammation and adhesions of the omentum to the right  upper quadrant and gallbladder.  There was very little space to visualize.  Two 5 mm trocars were placed under direct visualization without difficulty.  The gallbladder was identified after pulling down a significant amount of  omentum, which was densely adhered to the inferior aspect of the gallbladder  wall.  The gallbladder was very tense and was aspirated.  On piercing the  gallbladder a significant amount of pus was encountered, and this was  aspirated out.  The liver was quite stiff and difficult to elevate the  gallbladder;  however, the fundus was grasped and retracted cephalad and the  neck of the gallbladder was identified.  On grasping it, it was obviously  gangrenous as was the remainder of the gallbladder.  On a very meticulous  and tedious dissection extending down from the neck of the gallbladder, the  cystic duct was finally identified.  There was a large stone impacted within  the neck of the gallbladder.  The cystic duct was dissected and a window was  created behind it; however, when placing the clip up posteriorly, the very  friable tissue literally fell apart.  For this reason it was grasped with an  alligator clamp and I was able to put three separate clamps on the cystic  duct.  Cholangiogram could not be performed.  The cystic artery was more  easily identified, dissected in a similar manner, clipped, and divided.  Because of the significant inflammation there was some bleeding of the bed  of the gallbladder, which was controlled with electrocautery  on dissection  and removal of the gallbladder.  After the gallbladder was removed and  placed in an EndoCatch bag, I reinspected the area of dissection and was  comfortable that I could see the clips on the cystic duct and on pulling on  the cystic duct, I could see the common duct.  At this point a drain was  placed into the bed of the gallbladder.  Surgicel was placed into the bed of  the gallbladder.  Pneumoperitoneum was released.  The drain was sutured in  place.  The supraumbilical fascial defect  was closed with the 0 Vicryl pursestring suture and the skin incisions were  closed with subcuticular 4-0 Monocryl.  Steri-Strips and sterile dressings  were applied.  The patient tolerated the procedure well and went to PACU in  good condition.                                               Vikki Ports, M.D.    KRH/MEDQ  D:  11/05/2003  T:  11/06/2003  Job:  [id number]

## 2010-10-04 NOTE — H&P (Signed)
[name], [name]                         ACCOUNT NO.:  [id number]   MEDICAL RECORD NO.:  [id number]                   PATIENT TYPE:  INP   LOCATION:  2550                                 FACILITY:  [facility]   PHYSICIAN:  Vikki Ports, M.D.         DATE OF BIRTH:  11/08/1945   DATE OF ADMISSION:  10/31/2003  DATE OF DISCHARGE:                                HISTORY & PHYSICAL   ADMISSION DIAGNOSIS:  Acute cholecystitis.   HISTORY OF PRESENT ILLNESS:  The patient is a 65 year old white male with a  two-day history of worsening abdominal pain in the epigastrium and chest.  He was seen in the emergency room and underwent cardiac evaluation which was  negative and underwent gallbladder ultrasound which showed a positive  Murphy's sign, infected gallbladder wall and sludge.  The patient's liver  function tests were normal except for an elevated bilirubin of 1.6 and white  count of 15,000.   REVIEW OF SYSTEMS:  Negative for fever, chills, nausea or vomiting.   PAST SURGICAL HISTORY:  Multiple orthopedic procedures including shoulder  and knee surgery as well as lumbar fusion.   PAST MEDICAL HISTORY:  None.   MEDICATIONS:  None.   ALLERGIES:  No known drug allergies.   PHYSICAL EXAMINATION:  GENERAL:  He is an age-appropriate white male in no  distress.  VITAL SIGNS:  Temperature 99, heart rate 68, respiratory rate 16.  HEENT:  Benign.  Normocephalic, atraumatic.  Pupils are equal, round and  reactive to light.  He has partial dentition.  NECK:  Supple and soft without thyromegaly or cervical adenopathy.  LUNGS:  Clear to auscultation and percussion x2.  HEART:  Regular rate and rhythm.  No murmurs, rubs or gallops.  ABDOMEN:  Moderately obese.  Tender in the right upper quadrant.  Minimal  palpation.  EXTREMITIES:  No clubbing, cyanosis or edema.   IMPRESSION:  Acute cholecystitis.   PLAN:  Admission, antibiotics and laparoscopic cholecystectomy with  intraoperative  cholangiogram.                                                Vikki Ports, M.D.    KRH/MEDQ  D:  11/01/2003  T:  11/01/2003  Job:  [id number]

## 2010-10-04 NOTE — Discharge Summary (Signed)
[name], [name]                         ACCOUNT NO.:  [id number]   MEDICAL RECORD NO.:  [id number]                   PATIENT TYPE:  INP   LOCATION:  3019                                 FACILITY:  [facility]   PHYSICIAN:  Stefani Dama, M.D.               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  2. Left lumbar radiculopathy.  3. Morbid obesity.   OPERATION:  1. L3, L4, and L5 laminectomies, posterior lumbar interbody fusion, L3-4 and     L4-5.  2. Segmental pedicular fixation, L3, L4, and L5.  Synthes peak bone cages     with autografts.  Posterolateral arthrodesis, L3-L5, including L4.   CONDITION ON DISCHARGE:  Improving.   HOSPITAL COURSE:  The patient is a 65 year old individual with significant  back and lower extremity pain.  He had been evaluated as an outpatient and  found to have severe spondylitic stenosis at L3-4 and L4-5.  L4-5 was the  worst level.  He was advised regarding decompression arthrodesis.  He was  taken to the operating room on December 13 where he underwent two level  decompression arthrodesis.  Postoperatively he was slow to mobilize;  however, after gradual mobilization he seemed to do quite well.  However,  because he lives independently it is felt that he would need help to become  independent in ambulation.  He is being transferred to the rehab ward on  4100 to work with intensive physical therapy.  He has had a problem with  left lumbar radiculopathy and what was felt to be a greater trochanteric  bursitis; however, an MRI of the hip did not show any evidence for bursitis  and his symptoms are most consistent with an L5  radiculopathy.  This is  being treated with Neurontin and the passage of time.  Followup CT scans did  not demonstrate any impingement only or any evidence of cutout of his  hardware.  He will be seen in the office in two weeks.  His incision is  clean and dry.                                                Stefani Dama, M.D.    [name]  D:  05/09/2003  T:  05/10/2003  Job:  [id number]

## 2010-10-04 NOTE — Op Note (Signed)
[name], [name]               ACCOUNT NO.:  [id number]   MEDICAL RECORD NO.:  [id number]          PATIENT TYPE:  INP   LOCATION:  2860                         FACILITY:  [facility]   PHYSICIAN:  Feliberto Gottron. Turner Daniels, M.D.   DATE OF BIRTH:  Oct 19, 1945   DATE OF PROCEDURE:  07/01/2004  DATE OF DISCHARGE:                                 OPERATIVE REPORT   PREOPERATIVE DIAGNOSIS:  End stage arthritis, left shoulder.   POSTOPERATIVE DIAGNOSIS:  End stage arthritis, left shoulder, massive  rotator cuff tear.   PROCEDURE:  Right shoulder hemiarthroplasty using DePuy global hemi shoulder  components, a #10 stem and a 56 x 18 CTA head, no cement.   SURGEON:  Feliberto Gottron. Turner Daniels, M.D.   FIRST ASSISTANT:  Erskine Squibb B. Jannet Mantis.   ANESTHETIC:  General endotracheal.   ESTIMATED BLOOD LOSS:  500 cc.   FLUID REPLACEMENT:  1500 cc crystalloid.   DRAINS PLACED:  None.   TOURNIQUET TIME:  None.   INDICATIONS FOR PROCEDURE:  A 65 year old gentleman who has already had a  right shoulder hemiarthroplasty for massive rotator cuff tear with arthritic  changes and done well.  He has similar findings on the left side and after  putting up with the pain and disability for a number of years, he desires  elective left shoulder hemiarthroplasty using a CTA head.  With the rotator  cuff deficiency, we will not replace the glenoid because of the cuff  deficiency.  The risks, benefits of surgery are well understood by the  patient.   DESCRIPTION OF PROCEDURE:  The patient identified by armband, taken the  operating room at [facility] main hospital. Appropriate site monitors were  attached.  General endotracheal anesthesia induced with the patient in  supine position and 1 gram of Ancef given perioperatively. He was then  placed in the beach chair position and the left upper extremity prepped and  draped in sterile fashion from the wrist to the hemithorax. We began the  procedure by infiltrating the skin along the  anterior aspect of the shoulder  with 20 cc of 0.5% Marcaine and epinephrine solution starting out a few  centimeters above the coracoid and then going distal and towards the deltoid  insertion. We then made a deltopectoral approach to the shoulder again  starting out at the level of the clavicle going over the coracoid and then  curving gently towards the deltoid insertion over a distance of about 15 cm.  Small bleeders in the skin and subcutaneous tissue identified and  cauterized. The fascia over the cephalic vein was incised and the cephalic  vein was retracted laterally and superiorly with the deltoid muscle and the  pectoralis muscle retracted inferiorly and medially. This exposed the  conjoined tendon on the coracoid process as well as the subscapularis as it  inserted on the humeral head.  An Anspach retractor was used for of  retraction this point and we then elevated the subscapularis off of its  insertion on the lesser tuberosity in continuity with the shoulder anterior  capsule, because there was no contracture. This was  tagged with two #2  FiberWire sutures and retracted medially, exposing the humeral head. We then  externally rotated the shoulder and dislocated the humeral head and brought  up the resection guide from the DePuy global total shoulder set and with the  shoulder externally rotated 30 degrees, an anterior to posterior cut was  made the humeral head along the lines of the cutting guide removing 18-20 mm  of the humeral head in one piece which we then sized for a 36 x 18 trial  replacement. The insertion of the supraspinatus was noted to be deficient  and because of this, we went ahead and set up for a CTA hemiarthroplasty.  Just behind the bicipital groove and just medial to it, we entered the  proximal humerus with the hand reamers and reamed up to a 10. This had a  good fit and fill and then we brought in the CTA cutting guide and performed  a proximal humerus  resection in line with the CTA component. With careful to  protect the biceps tendon with a small Homan retractor during this part of  the procedure laterally and another Homan medially. Because of the bone  quality we did not use any body chisel.  We simply broached up to a #10  broach from here and obtained a good firm fit and performed trials with the  56 x 18 and the 56 x 22 CTA trials. The 56 x 18 had the best fit. At this  point the trial components were removed. The wound was then lavaged clean  and dry with suction and sponges.  A 10 stem was then hammered into place  with a good fit and fill followed by a 56 x 18 CTA head and the shoulder was  once again reduced, taken through range of motion and excellent stability  noted. Using #2 FiberWire that we then repaired the subscapularis back down  to the lesser tuberosity obtaining good firm fixation through bone tunnels.  We again checked for shoulder stability. He could be externally rotated  easily to 60 degrees without difficulty and internal rotation was easily  stable to the belly.  Flexion was stable to 120 degrees.  Once again small  bleeders were identified and cauterized.  The wound was washed out with  normal saline solution.  The subcutaneous tissue closed with running 2-0  Vicryl suture and the skin with running interlocking 3-0 nylon suture. A  dressing of Xeroform 4x4 dressing sponges, Hypafix tape and a sling was  applied. The patient was then awakened and taken to the recovery room  without difficulty.      FJR/MEDQ  D:  07/01/2004  T:  07/01/2004  Job:  [id number]

## 2010-10-04 NOTE — Discharge Summary (Signed)
[name], [name]               ACCOUNT NO.:  [id number]   MEDICAL RECORD NO.:  [id number]          PATIENT TYPE:  INP   LOCATION:  5039                         FACILITY:  [facility]   PHYSICIAN:  Feliberto Gottron. Turner Daniels, M.D.   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   PROCEDURE:  Left shoulder hemiarthroplasty.   HISTORY OF PRESENT ILLNESS:  The patient is a 65 year old gentleman who has  already had a right shoulder hemiarthroplasty for massive rotator cuff tear  with arthritic changes and done well.  He has similar findings on the left  side after putting up with the pain and disability for a number of years,  failing conservative measures.  He desires elective left shoulder  hemiarthroplasty using CTA head.  For the rotator cuff deficiency, we will  not replace the glenoid because of cuff deficiency.  Risks and benefits of  surgery were well understood by the patient.   ALLERGIES:  No known drug allergies.   MEDICATIONS:  Ibuprofen which is stopped prior to surgery.   PAST MEDICAL HISTORY:  Usual childhood diseases.  Adult history of DJD.   PAST SURGICAL HISTORY:  Right knee scope.  C3-4 fusion.  Lumbar diskectomy.  Cholecystectomy.  Left hand surgery.  Right shoulder hemiarthroplasty and  left shoulder scope.  No difficulty with GET.   SOCIAL HISTORY:  No tobacco, occasional alcohol, no IV drug abuse.  He is  divorced.   FAMILY HISTORY:  Mother died at 25 due to MI.  Father died at 37 due to  cancer.   REVIEW OF SYSTEMS:  Positive for glasses, easy bleeding, and upper dentures.  No recent illness, __________  or chest pain.   PHYSICAL EXAMINATION:  VITAL SIGNS:  Temperature 98.6, respirations 20,  pulse 96, blood pressure 130/80.  The patient is a 5 feet 10 inch, 240 pound  male.  HEENT:   Head is normocephalic and atraumatic.  Ears; TM's are clear.  Eyes;  pupils equal, round, and reactive to light and accommodation.  Nose patent.  Throat benign.  NECK:  Supple with full range of motion.  CHEST:  Clear to auscultation and percussion.  HEART:  Regular rate and rhythm.  ABDOMEN:  Soft and nontender.  EXTREMITIES:  Left shoulder with forward flexion to 80 degrees, external  rotation 0 degrees, internal rotation 5 degrees, positive intermittent  numbness to bilateral hands, positional.   LABORATORY DATA:  Preoperative labs including CBC, CMET, chest x-ray, EKG,  PT, and PTT are all within normal limits.   HOSPITAL COURSE:  The patient on the day of surgery was taken to the  operating room at [facility] where he underwent a right shoulder  hemiarthroplasty using __________  hemi shoulder components and #10 stem,  neck 58 x 18 CTA head uncemented.  No drains were placed.  The patient was  placed on preoperative antibiotics.  She was placed on PCA  Dilaudid for pain  control postoperatively and physical therapy was begun on the first  postoperative day.  On postoperative day #1, the patient was awake and alert  in moderate pain, controlled well with PCA Dilaudid, no nausea and vomiting.  On postoperative day #1, vital signs were stable, hemoglobin 13.3, WBC 7.3,  voiding well.  PT 12.5 preoperatively.  He continued with pendulum  exercises.  On postoperative day #2, the patient was complaining of pain and  he was afebrile.  Vital signs were stable.  Dressing was in place.  There  was scant drainage.  He was neurovascularly intact.  He was not yet cleared  for OT with the bilateral nature of his needs, so he was held to meet his PT  and OT goals.  Home health agency was contacted to help continue with this  once he was discharged.  On postoperative day #3, the patient was  complaining of moderate pain.  Current temperature at the time of discharge  was 99.3.  Dressing  was scant with serous drainage.  Wound was benign, mild  edema, neurovascularly intact.  He was medically stable and orthopedically  improved.  He was discharged home in the care of his family.   DISCHARGE MEDICATIONS:  1.  Vicodin.  2.  Resume ibuprofen.   ACTIVITY:  Gentle range of motion.  Will lift upper extremities.  Return to  clinic in 1 week or sooner if he is having increasing temperature, drainage,  or pain.  Dressing changes every other day or p.r.n.  May shower on  postoperative day #5.  Diet is regular.  Home Health PT and Home Health OT,  Home health aide if approved.      JBR/MEDQ  D:  09/10/2004  T:  09/11/2004  Job:  [id number]

## 2010-10-04 NOTE — Discharge Summary (Signed)
NAMETAVIO, [name]                         ACCOUNT NO.:  [id number]   MEDICAL RECORD NO.:  [id number]                   PATIENT TYPE:  INP   LOCATION:  5733                                 FACILITY:  [facility]   PHYSICIAN:  Vikki Ports, M.D.         DATE OF BIRTH:  [birth year]   DATE OF ADMISSION:  10/31/2003  DATE OF DISCHARGE:  11/08/2003                                 DISCHARGE SUMMARY   ADMISSION DIAGNOSIS:  Gangrenous cholecystitis.   DISCHARGE DIAGNOSIS:  Gangrenous cholecystitis.   HISTORY OF PRESENT ILLNESS:  The patient is a 65 year old white male with  worsening history of abdominal  pain in the epigastrium.  On exam he had a  very positive Murphy sign.  Liver tests were negative except for bilirubin  of 1.6 and he had a white count of 15,000.   HOSPITAL COURSE:  The patient was taken to the operating room where he  underwent laparoscopic cholecystectomy.  Drain was placed because of the  gangrenous nature of this.  He was maintained on IV antibiotics.  However,  on postoperative day #2 his bilirubin had decreased down to 0.6, but it  appeared that he had bilious drainage from his JP drain.  He was still  having fevers to about 102 and therefore antibiotics were continued.  ERCP  was done on postoperative day #2 which showed no evidence of bile leak.  The  JP output significantly decreased by postoperative days 4, 5 and 6.  He was  changed to p.o. antibiotics which he tolerated well and remained afebrile  for 48 hours and was discharged home on postoperative day #6.                                                Vikki Ports, M.D.    KRH/MEDQ  D:  12/06/2003  T:  12/06/2003  Job:  [id number]

## 2010-10-04 NOTE — Discharge Summary (Signed)
[place]. [facility]  Patient:    [name], [name]                      MRN: [id number] Adm. Date:  [id number] Disc. Date: [id number] Attending:  Alinda Deem Dictator:   Dorthula Matas, P.A.-C.                           Discharge Summary  PRIMARY ADMISSION DIAGNOSIS:  Right shoulder end-stage degenerative joint disease.  PROCEDURE:  Right shoulder hemiarthroplasty.  HISTORY OF PRESENT ILLNESS:  This patient is a 65 year old man that first saw Dr. Turner Daniels a year and a half after an injury at work.  He was felt to have an impingement syndrome with a rotator cuff tear.  Arthroscopic surgery showed him to have a complete rotator cuff tear with the supraspinatus retracted well beyond the glenoid and a huge posterior glenoid spur which was removed. Postoperatively, he has continued to have significant pain.  He was felt to have a cervical radiculopathy as well and underwent a cervical effusion.  He has had persistent pain after this into his right shoulder.  He has been treated conservatively with anti-inflammatory medications, job modifications, physical therapy, as well as local anesthestic injection which gave him about 80% pain relief.  Other conservative measures consisted of Hyalgan injections were discussed, and because of this, after discussing benefits versus risks, the patient has elected to proceed with right shoulder hemiarthroplasty with a known rotator cuff arthropathy.  ALLERGIES:  No known drug allergies.  MEDICATIONS AT TIME OF DISCHARGE:  Darvocet-N 100 one to two p.o. q.4h. p.r.n. pain.  PAST MEDICAL HISTORY:  Usual childhood illnesses.  Adult history only for DJD.  PAST SURGICAL HISTORY:  Rotator cuff scope 2001, C3-C4-C5 fusion in 2002, right knee scope in 1979 and 1980, lower back surgery in 1971, no difficulties with GET.  SOCIAL HISTORY:  No tobacco, rare ethanol, no IV drug abuse.  He is a Curator.  He is divorced and  lives alone.  FAMILY HISTORY:  Mother deceased at age 73, positive for DJD only.  Father deceased at age 11 from cancer.  REVIEW OF SYSTEMS:  Positive for glasses and upper dentures.  He denies any shortness of breath or chest pain.  PHYSICAL EXAMINATION ON ADMISSION:  VITAL SIGNS:  Temperature is afebrile, pulse 70, respirations 16, blood pressure 160/94.  The patient is a 5-foot 10, 250-pound male.  HEENT:  Head is normocephalic, atraumatic.  Ears:  TMs are patent.  Eyes: PERRLA.  Nose is benign.  Throat is clear.  NECK:  Supple, full range of motion.  CHEST:  Clear to auscultation and percussion.  CARDIOVASCULAR:  Heart regular rate and rhythm.  ABDOMEN:  Soft, nontender, no masses, bowel sounds 2+.  EXTREMITIES:  Right shoulder range of motion:  Forward flexion 20 degrees with positive pain and crepitus, abduction 80 degrees with positive pain, external rotation 20 degrees with pain, positive weakness to rotator cuff testing, otherwise neurovascularly intact.  SKIN:  Intact with well-healed scars from shoulder scope.  LABORATORY AND X-RAY DATA:  X-rays show cystic changes throughout the glenohumeral region.  Preoperative labs were all within normal range with the exception of ALT of 49.  HOSPITAL COURSE:  On the date of admission, the patient was taken to the operating room at [facility] where he underwent a right shoulder hemiarthroplasty using [employer] system with a 10-mm  stem and a 5 x 18 CTA head.  No drains were placed.  He was placed on perioperative antibiotics for the first 48 hours.  He was placed on a PCA morphine pump for pain control postoperatively, and physical therapy was begun as well.  Postop day #1, the patient was awake and alert in moderate pain using PCA well. Vital signs were stable.  Hematocrit 38.  Dressing was dry.  He was neurovascularly intact distally with positive 2+ pulses in the wrist in right upper extremity.  He  began pendulum and passive flexion range of motion exercises with physical therapy.  He also received occupational therapy beginning that day.  Postoperative day #2, the patient was complaining of pain only slightly improved with PCA morphine.  Also, complained of headache, so the PCA was discontinued.  Vital signs were stable.  He was afebrile. Hemoglobin 11.4, WBC 12.5.  Dressing was dry, and he continued physical therapy.  Postoperative day #3, the patient was without complaint and was ready to go home.  He was taking p.o.s and voiding without difficulty.  Vital signs were stable.  He was afebrile.  Right shoulder wound was benign, and he was neurovascularly intact to right upper extremity.  Hemoglobin 12.1, hematocrit 35.  CONDITION AT DISCHARGE/DISPOSITION:  He was assessed to be satisfactory postop course right shoulder hemiarthroplasty and was discharged home to the care of his friends.  DISCHARGE INSTRUCTIONS:  Dressing changes q.d.  Regular diet.  DISCHARGE MEDICATIONS:  Prescription for Percocet given.  DISCHARGE FOLLOWUP:  He is to follow up with Dr. Turner Daniels in five to seven days, sooner if he should have any increase in temperature, any drainage from the wound, or pain not well controlled by oral pain meds.  SPECIAL DISCHARGE INSTRUCTIONS:  He remains doing simple range of motion exercises, but will not be using the arm for several weeks in any active way.    NEUROLOGIC:  GENITOURINARY:  RECTAL:  LABORATORY AND X-RAY DATA: DD:  09/07/00 TD:  09/07/00 Job: [id number]

## 2010-10-04 NOTE — Discharge Summary (Signed)
NAMEAAYANSH, CODISPOTI               ACCOUNT NO.:  [id number]   MEDICAL RECORD NO.:  [id number]          PATIENT TYPE:  INP   LOCATION:  5039                         FACILITY:  [facility]   PHYSICIAN:  Feliberto Gottron. Turner Daniels, M.D.   DATE OF BIRTH:  03-24-1946   DATE OF ADMISSION:  04/13/2006  DATE OF DISCHARGE:  04/17/2006                               DISCHARGE SUMMARY   PRIMARY DIAGNOSIS:  End-stage degenerative joint disease of the left  knee.   PROCEDURE WHILE IN HOSPITAL:  Left total knee arthroplasty.   DISCHARGE SUMMARY:  The patient is a 65 year old gentleman who had a  previous right total knee done by Dr. Turner Daniels as well as a couple of  shoulder hemiarthroplasties done by Dr. Turner Daniels in the past.  He has done  well with all these procedures.  He now has end-stage arthritis of the  left knee and now desires total knee arthroplasty.  The risks and  benefits of the surgery are well-known to the patient.  Questions were  answered prior to the procedure.   The patient's medical history and past surgical history is fond on his  admission history and physical, and there are no changes to that.  The  patient's physical exam was unremarkable and also can be found on the  preoperative history and physical.   HOSPITAL COURSE:  On the date of admission the patient was taken to the  operating room at [facility], where he underwent a left total knee  arthroplasty using Osteonics Scorpio components, a #11 tibia, a #9  femur, a 10 mm PF spacer, and a #9 patellar button, all components  cemented with a double batch of [employer]med[employer] polymethyl methacrylate with  1500 mg of Zinacef embedded into the cement.  A double-armed Hemovac  drain was placed into the wound.  A Foley catheter was placed for the  patient's urinary tract and he was placed on perioperative antibiotics.  He was placed on postoperative Coumadin prophylaxis with bridging  Lovenox coverage.  He was also placed on a postoperative PCA for  pain  control with Dilaudid.  Physical therapy was begun in the recovery room  with CPM.  The patient's hospital course proceeded upon regular  protocols with the exception that he was making very slow progress with  physical therapy, partly due to the fact that he has had the previous  hemiarthroplasties of the shoulder, which make ambulation with a walker  more difficult.  He did have two evenings where he had fevers of 101 and  at that point a chest x-ray was taken, which was clear.  Because of  swelling in his left leg, he was also sent for venous Dopplers, which  again were clear for any type of DVT.   Dictation ended at this point.      Laural Benes. Jannet Mantis.      Feliberto Gottron. Turner Daniels, M.D.    Merita Norton  D:  04/17/2006  T:  04/17/2006  Job:  [id number]

## 2010-10-04 NOTE — Discharge Summary (Signed)
[name], [name]               ACCOUNT NO.:  [id number]   MEDICAL RECORD NO.:  [id number]          PATIENT TYPE:  INP   LOCATION:  5039                         FACILITY:  [facility]   PHYSICIAN:  Feliberto Gottron. Turner Daniels, M.D.   DATE OF BIRTH:  [birth year]   DATE OF ADMISSION:  04/13/2006  DATE OF DISCHARGE:  04/17/2006                               DISCHARGE SUMMARY   PRIMARY DIAGNOSIS:  End-stage degenerative joint disease of the left  knee.   PROCEDURE WHILE IN HOSPITAL:  Left total knee arthroplasty.   DISCHARGE SUMMARY:  The patient is a 65 year old gentleman well-known to  Dr. Turner Daniels because he had a previous right total knee done by Dr. Turner Daniels  as well as a couple of hemi shoulder arthroplasties done by Dr. Turner Daniels in  the past. He has done well with these procedures. The patient's knee has  continued to give him pain despite conservative measures, and he now  desires to proceed with a left total knee arthroplasty. Risks and  benefits of the surgery are well-known to the patient, and questions  were answered prior to his admission.   ALLERGIES:  He has no known drug allergies.   MEDICATIONS AT TIME OF ADMISSION:  None.   PAST MEDICAL HISTORY:  Childhood diseases. Known history of DJD.   PAST SURGICAL HISTORY:  Right total knee 2003, back surgery 2004,  cervical fusion 2003, bilateral shoulder replacements in 2004-2005.   SOCIAL HISTORY:  No tobacco. Positive ethanol occasionally. No IV drug  abuse. He is divorced. He lives alone.   FAMILY HISTORY:  Mother died at age 75 due to MI. Father died at age 66  due to cancer.   REVIEW OF SYSTEMS:  Positive for glasses and upper dentures. He denies  any recent illness, shortness of breath or chest pain.   PHYSICAL EXAMINATION:  VITAL SIGNS:  Temperature 99.1, pulse 71, blood  pressure 128/81, 5 feet 10 inches, 217 pound male.  HEENT:  Head normocephalic, atraumatic. Ears:  TMs.  Eyes:  Pupils  equal, round and reactive to light and  accommodation.  __________ patent  throat benign.  NECK:  Supple with decreased range of motion secondary to fusion.  CHEST:  Clear to auscultation and percussion.  HEART:  Regular rate and rhythm.  ABDOMEN:  Soft, nontender.  EXTREMITIES:  Right knee range of motion 0-100 degrees. Left knee 10 to  100 degrees with positive crepitus and pain.  SKIN:  Shows well-healed normal scar on the right total knee. No break  in the skin on the left. X-rays show bone on bone changes of the left  knee.   LABORATORY DATA:  Preoperative labs including CBC, CMET, chest x-ray,  EKG, PT, and PTT were all within normal limits.   HOSPITAL COURSE:  On the date of admission the patient was taken to [facility] where he underwent a left total knee arthroplasty using  Osteonics Scorpio components #11 tibia, #9 femur, #9 patella with a 10  mm PS spacer. All components cemented with __________ cement. Medium  Hemovac drain was placed into the wound prior to  closure. Foley catheter  was placed prior to surgery. The patient was placed on perioperative  antibiotics. She was placed on postoperative Coumadin prophylaxis with  Lovenox coverage until he became therapeutic per pharmacy protocol. The  patient was placed on a PCA Dilaudid pain pump for pain control, and  physical therapy was begun during the PACU stay with a CPM.  Postoperative day #1 the patient was awake and alert in moderate pain.  He had complained of poor sleep. Vital signs were stable. Hemoglobin 13,  WBC 9.3. Drain output less than 50 cc and DC'd without difficulty. PT  13.5. Urine output 300 cc. Discharge planning was begun. It was thought  that the patient would probably need short-term nursing care due to his  lack of home support and his slow progress in physical therapy most  notably because of difficulties using his upper arms due to the shoulder  arthroplasties. Postoperative day #2 the patient was complaining of  numbness in his left  foot as well as increased pain. T-max 101.3, range  98.5. Vital signs were stable. Hemoglobin 11.4, WBC 12.1. INR 2. X-rays  showed the left knee to be well-placed and no signs of any difficulty  with the components. Dressing was dry and loose.  Wound was benign, calf  was tender and somewhat tense with decreased sensation __________ was  intact. Venous Dopplers were done which ruled out any DVT in that leg.  Postoperative day #3 the patient was complaining of moderate pain and  had another spike of temperature the night before to 101.6 ranging to  98.6 that morning. He was, otherwise, alert and oriented. Dressing was  dry. Wound was benign. There was no sign of infection. Lungs were clear.  Due to the patient's increased temperature and lack of other areas of  possible sources of infection, chest x-ray was taken which showed no  active disease. Lungs were essentially clear except for a few linear  densities consistent with atelectasis. He was not ready for discharge  due to fever as well as lack of progress in PT. Postoperative day #4 the  patient was awake and alert, decreased pain, taking p.o. as well. Vital  signs were stable. T-max was 100.8. Wound was clean and dry. He was,  otherwise, neurovascularly intact. He was moving his foot up and down  well, and the numbness had dissipated in the foot. INR 2.9. Hemoglobin  11. He walked halfway down the hallway the day before. Because of his  need for additional social and physical support at home, he will be  discharged to the care of a short-term facility for skilled nursing as  well as continued PT.   DISCHARGE MEDICATIONS:  1. Colace 100 mg b.i.d.  2. Coumadin per pharmacy protocol. It is being held until his INR      becomes in the therapeutic range of 1.5 to 2 which is where we      would like to keep him.  3. Iron sulfate 327 mg t.i.d.  4. Fleet enema as needed.  5. Percocet 5/325 mg 1-2 tablets q.4 h. p.r.n. pain. 6. Phenergan  25 mg suppository or tablet q.6 h. as needed for nausea.  7. Restoril 15 mg as needed for sleep.  8. Robaxin 500 mg p.o. q.6 h. p.r.n. spasm.  9. Senokot as needed for constipation.   ACTIVITY AT TIME OF DISCHARGE:  Weightbearing as tolerated with total  knee precautions. Wound care, dressing changes daily with gauze and ACE.  May shower  on postoperative day #5 in a seated shower.   DIET:  Regular.   FOLLOWUP:  The patient should return to see [place] for followup check within  one week of this discharge. He should call [phone] to make that  appointment.      Laural Benes. Jannet Mantis.      Feliberto Gottron. Turner Daniels, M.D.  Electronically Signed    JBR/MEDQ  D:  04/17/2006  T:  04/17/2006  Job:  [id number]

## 2010-10-04 NOTE — Op Note (Signed)
[place]. [facility]  Patient:    [name]                       MRN: [id number] Proc. Date: 12/17/99 Adm. Date:  [id number] Attending:  Alinda Deem                           Operative Report  PREOPERATIVE DIAGNOSES: 1. Right shoulder impingement. 2. Rotator cuff tear. 3. Labrale tear.  POSTOPERATIVE DIAGNOSES: 1. Right shoulder impingement syndrome. 2. Acromioclavicular joint arthritis. 3. Massive rotator cuff tear. 4. Calcification of the labrum. 5. Partial tear of the biceps. 6. Glenohumeral osteoarthritis.  PROCEDURES: 1. Right shoulder arthroscopic anterior and inferior acromioplasty. 2. Distal clavicle excision. 3. Removal of bony labrum and labrale tear debridement of a biceps tear 30%. 4. Debridement of chondromalacia of the humeral head. 5. Tuberoplasty.  SURGEON:  Alinda Deem, M.D.  FIRST ASSISTANT:  Dorthula Matas, P.A.-C.  ANESTHETIC:  Interscalene block with general LMA.  ESTIMATED BLOOD LOSS:  Minimal.  FLUID REPLACEMENT:  800 cc of crystalloid.  DRAINS:  None.  TOURNIQUET TIME:  None.  INDICATIONS:  The patient is a 65 year old man who injured her right shoulder at work some months ago.  He has failed conservative treatment.  He has an MRI scan showing thinning or attenuation of the rotator cuff, possible labrale tear, and AC joint arthritis.  He got about 70% pain relief from a cortisone shot and now has had recurrence of his symptoms and desires arthroscopic evaluation and treatment of his right shoulder.  DESCRIPTION OF PROCEDURE:  The patient was identified by arm band and was taken to the operating room at the [facility].  Appropriate anesthetic monitors were attached and interscalene block anesthesia induced to the right upper extremity.  This was followed by general LMA anesthesia in the supine position.  He was then placed in the beach chair position  and the right upper extremity prepped and draped in the usual sterile fashion from the wrist to the hemithorax.  The skin along the anterolateral posterior aspects of the acromion process was infiltrated with 2-3 cc of 0.5% Marcaine and epinephrine solution.  Dye flow was noted through the syringe, indicative of a complete rotator cuff tear.  Using a #11 blade, standard portals were then made 1.5 cm anterior to the [facility] joint, lateral to the junction of the middle and posterior third of acromion, and posterior to the posterolateral corner of the acromion process.  The inflow was placed anteriorly, the arthroscope laterally, and a 4.2 mm Great [place] sucker shaver from [employer] posteriorly.  We immediately encountered inflamed bursa and a massive rotator cuff complete of the supraspinatus, which was basically not found.  It was retracted way beyond the labrum and I could not identify any muscle fibers.  The posterior superior labrum was completely calcified as a loose body, actually a bony loose body, and this was removed piecemeal with a set of pituitary rongeurs.  The biceps had a 40% longitudinal tear and was debrided.  There was also grade 3 chondromalacia of the inferior anterior glenoid and this was also debrided.  An extremely impressive type 3 subacromial spur was encountered about 1 cm in depth.  This was removed with a 4.5 mm threaded vertex bur, as were similar spurs from the distal clavicle and the medial aspect of the acromion as well.  We were careful not to detach the deltoid origin since this was the only functional muscle in the superior aspect of the shoulder.  The infraspinatus tear was noted to go through about 50% of the muscle.  We performed a tuberoplasty, smoothing off the greater trochanter.  The shoulder was continuously washed with normal saline.  At the completion of the procedure, photographic documentation was made of the work we had accomplished.  At this point,  the arthroscopic instruments were removed.  A dressing of Xeroform, 4 x 8 dressing, sponges, and paper taper was applied.  The patient was awaken and taken to the recovery room without difficulty. DD:  12/17/99 TD:  12/18/99 Job: [id number]

## 2010-10-04 NOTE — Op Note (Signed)
[place]. [facility]  Patient:    [name], [name]                      MRN: [id number] Proc. Date: 06/02/00 Adm. 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  OPERATION PERFORMED:  Anterior cervical diskectomy and arthrodesis C3-4, structural allograft, Synthes fixation.  SURGEON:  Stefani Dama, M.D.  ASSISTANT:  Tanya Nones. Jeral Fruit, M.D.  ANESTHESIA:  General endotracheal.  INDICATIONS FOR PROCEDURE:  The patient is a 65 year old left-handed individual who has had a work-related incident in February this past year.  He had fallen over a piece of steel at work and injured himself and is undergoing a considerable amount of conservative management even up until the present. He failed to get significant relief of pain that resides in the region of the neck, shoulder.  He reports some symptoms that cause a lightning-like sensation across the shoulders and down to the neck.  MRI was ultimately performed and this demonstrates that the patient has a marked spondylitic process at C3-4 with evidence of effacement of the cord and biforaminal stenosis.  He was advised regarding surgical intervention.  He is now being taken to the operating room to undergo a single level anterior diskectomy and arthrodesis with structural allograft and Synthes fixation.  DESCRIPTION OF PROCEDURE:  The patient was brought to the operating room supine on a stretcher.  After the smooth induction of general endotracheal anesthesia, he was placed in halter traction of five pounds.  The neck was shaved, prepped with DuraPrep and draped in a sterile fashion.  A transverse incision was made in the left side of the neck and carried down through the platysma.  The plane between the sternocleidomastoid and the strap  muscles was dissected bluntly until the prevertebral space was reached.  The first identifiable disk space was noted to be that of C3-4.  Diskectomy at C3-4 was performed using a combination of Kerrison rongeurs to evacuate a significant quantity of markedly degenerated disk material.  Self-retaining disk spreader was placed into the wound.  The Anspach drill with a 2.3 mm dissecting tool was used to remove significant osteophytic overgrowth from the uncinate process first on the left side.  The uncinate process was taken off completely and the undersurface of the C3 vertebrae was cut down significantly.  The ligament which was markedly thickened in this area was then taken up with a 2 and a 3 mm Kerrison punch and this was resected.  Dissection was carried out laterally to either side.  Epidural venous bleeding was controlled with the bipolar cautery. The area ws irrigated copiously with saline once decompressed.  Attention was then turned to the right side where similar decompression was performed.  After this was examined, a cylindrical  bit was used to shape both ends of the disk space.  Then an 8 mm round fibular graft was packed with some of the osteophytic bone graft material.  This was placed into the interspace.  Traction was removed.  The neck was placed in slight flexion.  An 18 mm Synthes plate was affixed with four locking 4 x 16 mm screws.  The position of the plate and hardware was checked radiographically.  The area was copiously irrigated with antibiotic irrigating solution. Hemostasis in the soft tissues was obtained and doubly checked.  Then the platysma was closed with 3-0 Vicryl in interrupted fashion.  3-0 Vicryl was used in the subcuticular tissues and a clear plastic dressing was placed on the skin.  The patient tolerated the procedure well and was returned to the recovery room in stable condition. DD:  06/02/00 TD:  06/02/00 Job: [id number]

## 2010-10-04 NOTE — Op Note (Signed)
[name], [name]                         ACCOUNT NO.:  [id number]   MEDICAL RECORD NO.:  [id number]                   PATIENT TYPE:  INP   LOCATION:  5733                                 FACILITY:  [facility]   PHYSICIAN:  Vikki Ports, M.D.         DATE OF BIRTH:  01/24/1946   DATE OF PROCEDURE:  11/01/2003  DATE OF DISCHARGE:  11/08/2003                                 OPERATIVE REPORT   PREOPERATIVE DIAGNOSIS:  Acute cholecystitis.   POSTOPERATIVE DIAGNOSIS:  Gangrenous cholecystitis.   PROCEDURE:  Laparoscopic cholecystectomy.   SURGEON:  Vikki Ports, M.D.   ASSISTANT:  Magnus Ivan, RNFA   DESCRIPTION OF PROCEDURE:  The patient was taken to the operating room and  placed in a supine position.  After adequate general anesthesia was induced  via endotracheal, the abdomen was prepped and draped in the normal sterile  fashion.  A transverse infraumbilical incision was made and dissected down  to the fascia.  The fascia was opened vertically.  Pneumoperitoneum was  obtained and the gallbladder was identified.  It was grossly gangrenous and  very edematous.  It was aspirated.  Under direct visualization, a 10 mm port  was placed in the subxiphoid region and two 5 mm ports were placed in the  right abdomen.  The gallbladder was retracted cephalad.  It was very thick  walled, edematous, with a number of adhesions which were taken down.  The  neck of the gallbladder was gangrenous and literally fell apart on grasping  it.  What appeared to be the cystic duct at the base of the gallbladder was  grasped but also fell apart.  Clips were placed on this as was an endoloop.  The cystic artery was identified and was triply clipped and divided, peeled  off the gallbladder bed, and was placed in an endocatch bag and removed.  There was significant bleeding from the gallbladder bed which was controlled  with cautery and Surgicel.  A 19 Blake drain was placed in the  gallbladder  bed near the stump of the cystic duct.  The pneumoperitoneum was released.  The incisions were closed with subcuticular 4-0 Monocryl.  Steri-Strips and  sterile dressings were applied.  The patient tolerated the procedure well  and went to the PACU in good condition.                                               Vikki Ports, M.D.    KRH/MEDQ  D:  11/09/2003  T:  11/10/2003  Job:  [id number]

## 2010-10-04 NOTE — Op Note (Signed)
[place]  Patient:    [name], [name]                      MRN: [id number] Proc. Date: 08/10/00 Adm. Date:  [id number] Attending:  Alinda Deem                           Operative Report  PREOPERATIVE DIAGNOSES: 1. Rotator cuff arthropathy 2. End-stage arthritis of the right shoulder.  POSTOPERATIVE DIAGNOSES: 1. Rotator cuff arthropathy 2. End-stage arthritis of the right shoulder.  PROCEDURE:  Right shoulder hemiarthroplasty using the De[employer] system with a 10 mm stem and a 56 x 18 CTA head.  SURGEON:  Alinda Deem, M.D.  FIRST ASSISTANT:  Dorthula Matas, P.A.-C.  ANESTHESIA:  General endotracheal.  ESTIMATED BLOOD LOSS:  400 cc.  FLUID REPLACEMENT:  1 L crystalloid.  DRAINS PLACED:  None.  TOURNIQUET TIME:  None.  INDICATIONS FOR PROCEDURE:  A 65 year old man that I met about a year and a half ago after an injury at work.  He was felt to have an impingement syndrome with a rotator cuff tear.  At surgery he was found to have a complete rotator cuff tear with the supraspinatus retracted well behind the glenoid, and he had a huge posterior glenoid spur, which was removed.  Postoperatively he has continued to have pain.  He was felt to have a cervical radiculopathy as well, underwent a cervical fusion, but has had persistent pain into his right shoulder.  He has been treated conservatively with anti-inflammatory medicines, job modifications, and physical therapy.  Most recently he had yet another local anesthetic injection into his right shoulder, which gave him about 80% pain relief.  We discussed the possibility of Hyalgan injections, which he was not enamored with, and because of this we are going to go ahead and proceed with a right shoulder hemiarthroplasty for his known rotator cuff arthropathy.  The only time we scoped him, his biceps tendon was intact.  He does have a drop osteophyte on x-ray, and there  were areas of bare bone on the images taken at the time of his arthroscopy about a year ago.  DESCRIPTION OF PROCEDURE:  Patient identified by arm band, taken to the operating room at [facility] main hospital, appropriate anesthetic monitors were attached, and general endotracheal anesthesia induced with the patient in the supine position.  He was then placed in the beach chair position and the right upper extremity prepped and draped in the usual sterile fashion from the wrist to the hemithorax.  A standard anterior and anterolateral deltopectoral incision was made starting at the level of the clavicle, going just lateral to the coracoid process and extending distally for about 12 cm through the skin and subcutaneous tissue.  We identified the cephalic vein and retracted it laterally with the deltoid and attempted to protect it throughout the procedure during the retraction.  This took [place] down to the interval between the deltoid and pectoralis muscle, exposing the subscapularis incision, biceps screw, and the area where the rotator cuff was completely gone from.  We released the proximal half-inch of the pectoralis major insertion and then about a centimeter off of its insertion cut the subscapularis tendon separate from the capsule, which was also cut a centimeter off of its insertion, allowing for repair later in the case.  The shoulder was then externally rotated and delivered from the  joint itself, the humeral head.  The DePuy humeral head cutting guide was then brought into the wound, and a mark was made at the reflection of the synovium off of the cartilage, allowing [place] to perform a standard cut in the anatomic 30 degrees of retroversion.  Staying well lateral, we then axially reamed up to the 10 mm reamer and broached up to the 10 mm broach, which seated nicely.  We then performed the CTA cut with a CTA guide and selected at 56 x 18 CTA head to place on the broach.  The shoulder was then  reduced and found to have stability to internal rotation maximally and externally rotation.  He could be externally rotated about 50-60 degrees before it came out anteriorly, and that was without the subscapularis repair.  At this point, the broach and trial head were removed.  Some of the bone that was inside of the head was then removed with rongeurs and packed down into the proximal humerus, and we impacted in place a #10 stem, followed by a 56 x 18 CTA head.  The shoulder was once again reduced, and the stability was noted to be excellent.  We then repaired the anterior capsule and the subscapularis back to their insertions with the cuff of tissue that remained, using #2 Ethibond suture.  Once again we checked stability.  The shoulder could not be dislocated posteriorly, and anteriorly he came to a good block at about 60 of external rotation.  The biceps tendon was noted to be intact throughout the procedure and was not sacrificed.  At this point, the wound was washed out with normal saline solution once again.  The retractors were removed.  There were two holes now in the cephalic vein, which was then ligated proximally and distally because of the level of bleeding this entertains.  Once again the wound was washed out with normal saline solution. The subcutaneous tissue was closed with 0 and 2-0 undyed Vicryl suture and the skin with running interlocking 3-0 nylon suture.  A dressing of Xeroform, 4 x 4 dressing sponges, and paper tape applied.  Patient was placed in a shoulder immobilizer, awakened, and taken to the recovery room without difficulty. DD:  08/10/00 TD:  08/11/00 Job: [id number]

## 2011-02-27 LAB — POCT HEMOGLOBIN-HEMACUE
Hemoglobin: 16.5
Operator id: 112821

## 2011-08-20 ENCOUNTER — Other Ambulatory Visit: Payer: Self-pay | Admitting: Ophthalmology

## 2011-08-20 DIAGNOSIS — H4710 Unspecified papilledema: Secondary | ICD-10-CM

## 2011-08-25 ENCOUNTER — Ambulatory Visit
Admission: RE | Admit: 2011-08-25 | Discharge: 2011-08-25 | Disposition: A | Discharge status: Home / Self Care | Payer: Medicare Other | Source: Ambulatory Visit | Attending: Ophthalmology | Admitting: Ophthalmology

## 2011-08-25 DIAGNOSIS — H4710 Unspecified papilledema: Secondary | ICD-10-CM

## 2011-08-25 MED ORDER — GADOBENATE DIMEGLUMINE 529 MG/ML IV SOLN
20.0000 mL | Freq: Once | INTRAVENOUS | Status: AC | PRN
  Administered 2011-08-25: 20 mL via INTRAVENOUS

## 2011-09-30 ENCOUNTER — Other Ambulatory Visit (HOSPITAL_COMMUNITY): Payer: Self-pay | Admitting: Otolaryngology

## 2011-09-30 DIAGNOSIS — K1120 Sialoadenitis, unspecified: Secondary | ICD-10-CM

## 2011-10-02 ENCOUNTER — Ambulatory Visit (HOSPITAL_COMMUNITY)
Admission: RE | Admit: 2011-10-02 | Discharge: 2011-10-02 | Disposition: A | Discharge status: Home / Self Care | Payer: Medicare Other | Source: Ambulatory Visit | Attending: Otolaryngology | Admitting: Otolaryngology

## 2011-10-02 DIAGNOSIS — R221 Localized swelling, mass and lump, neck: Secondary | ICD-10-CM | POA: Insufficient documentation

## 2011-10-02 DIAGNOSIS — R51 Headache: Secondary | ICD-10-CM | POA: Insufficient documentation

## 2011-10-02 DIAGNOSIS — R220 Localized swelling, mass and lump, head: Secondary | ICD-10-CM | POA: Insufficient documentation

## 2011-10-02 DIAGNOSIS — K1120 Sialoadenitis, unspecified: Secondary | ICD-10-CM

## 2011-10-02 MED ORDER — IOHEXOL 300 MG/ML  SOLN
50.0000 mL | Freq: Once | INTRAMUSCULAR | Status: AC | PRN
  Administered 2011-10-02: 5 mL

## 2011-10-02 MED ORDER — TRIAMCINOLONE ACETONIDE 40 MG/ML IJ SUSP (RADIOLOGY): 40.0000 mg | Freq: Once | INTRAMUSCULAR | Status: DC

## 2012-07-10 IMAGING — RF DG SIALOGRAPHY*L*
5 series · 5 of 5 positions shown · non-contrast
Comparison: Brain MRI 08/25/2011.

CLINICAL DATA: 65-year-old male with unexplained left facial pain
and swelling.  Pain extends from the parotid level superiorly to
the scalp.

SIALOGRAPHY*L*

[Series 1: run · 1 of 1 slices shown (1 of 5)]
[im 1/1]
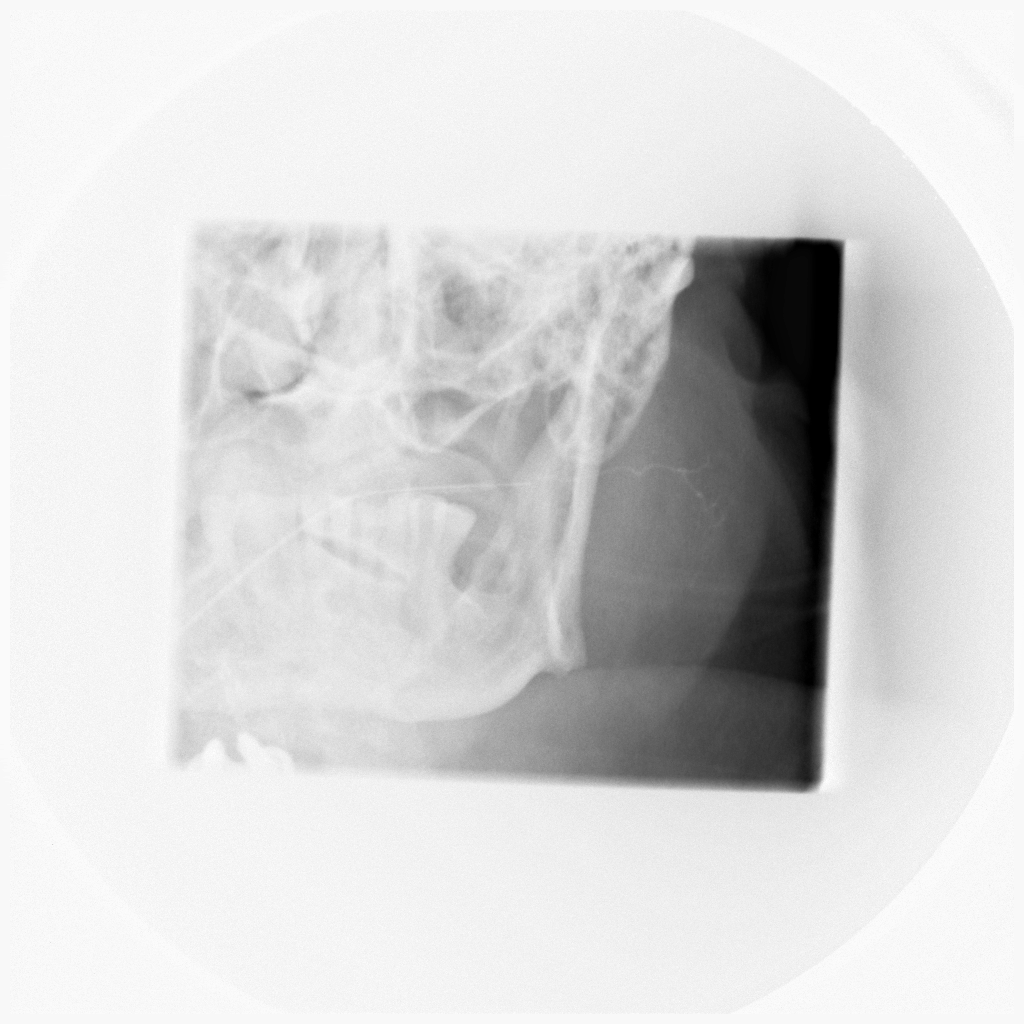

[Series 2: run · 1 of 1 slices shown (2 of 5)]
[im 1/1]
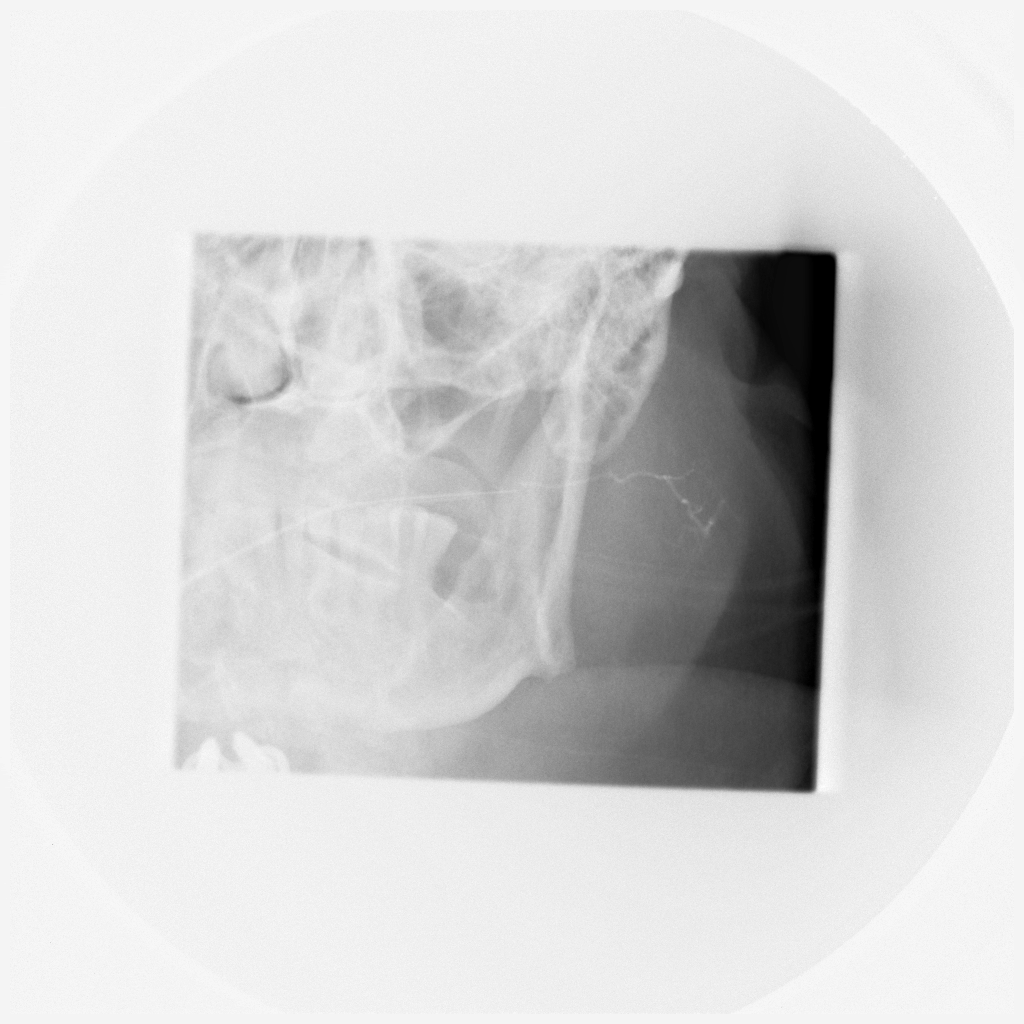

[Series 3: run · 1 of 1 slices shown (3 of 5)]
[im 1/1]
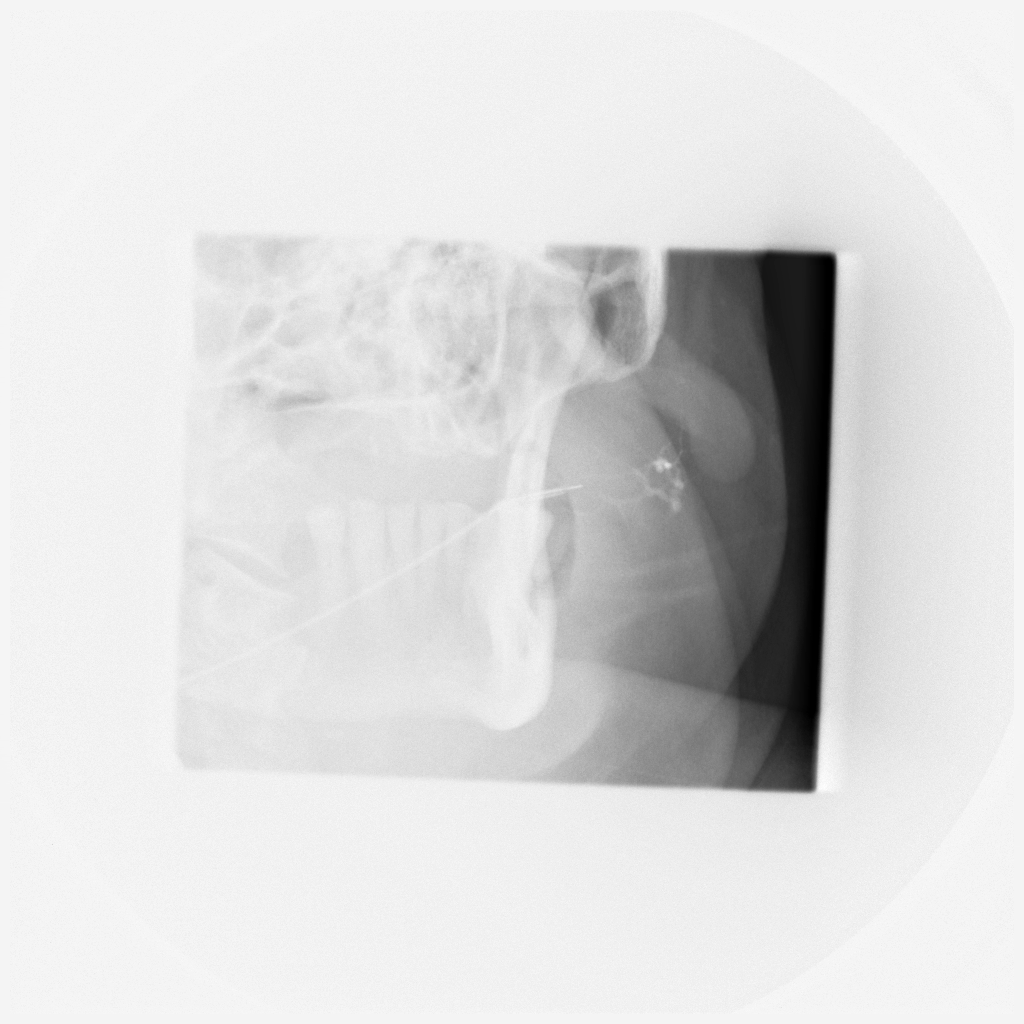

[Series 4: run · 1 of 1 slices shown (4 of 5)]
[im 1/1]
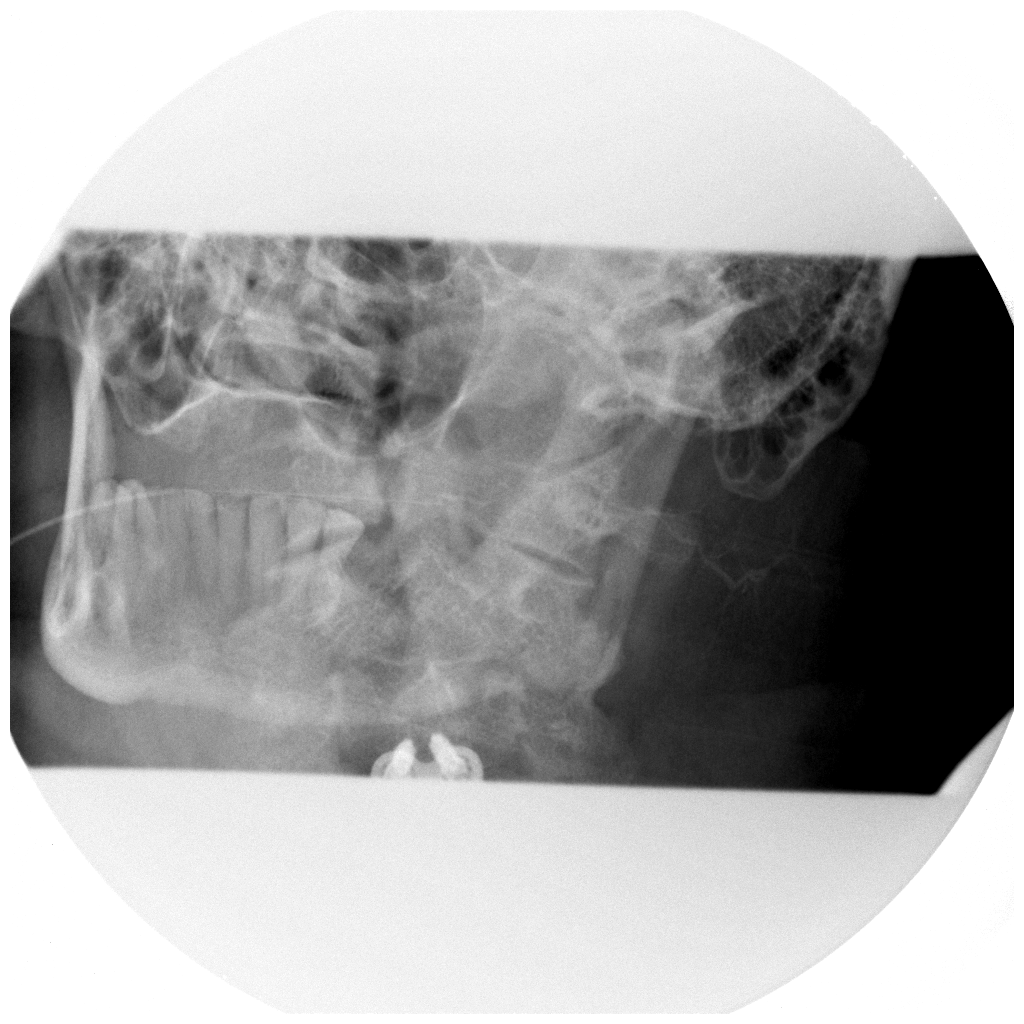

[Series 5: run · 1 of 1 slices shown (5 of 5)]
[im 1/1]
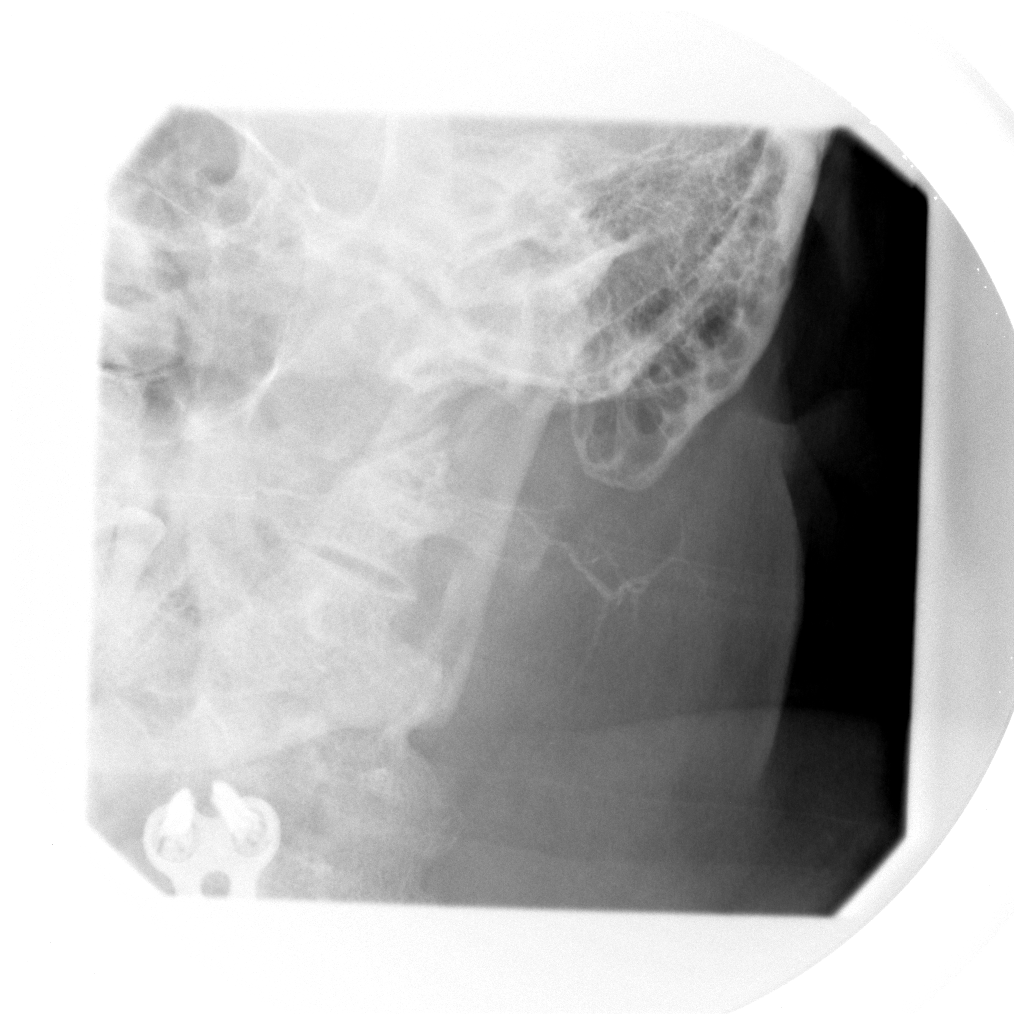

[5 of 5 positions shown; findings below may reference images not displayed]

Neck CT 10/11/2009.

Contrast:  2mnipaque-Y33.

 Medication:  Mixture of 0.5 ml Kenalog (20 mg/5 ml) plus 0.5 ml
sterile saline.

Technique/Findings: Following explanation of the procedure, the
left parotid duct orifice was carefully cannulated using a 30 gauge
sialogram catheter under direct visualization with the assistance
of Dr. Johnmark Noris.

Contrast injection, fluoroscopic observation, and spot film
evaluation demonstrates a normal main parotid duct.  Intraglandular
ductal branches are within normal limits.  No sacculation, filling
defect or stricture identified.

Following contrast injection 0.5 ml of the above medication mixture
was slowly injected into the duct.

The patient tolerated the procedure well and with no immediate
complications.
IMPRESSION: 1.  Normal left parotid sialogram.
2.  A mixture of Kenalog sterile saline injected into the parotid
duct.

## 2013-11-22 ENCOUNTER — Ambulatory Visit (INDEPENDENT_AMBULATORY_CARE_PROVIDER_SITE_OTHER): Payer: Medicare Other

## 2013-11-22 VITALS — BP 154/69 | HR 86 | Resp 12

## 2013-11-22 DIAGNOSIS — R52 Pain, unspecified: Secondary | ICD-10-CM

## 2013-11-22 DIAGNOSIS — G5761 Lesion of plantar nerve, right lower limb: Secondary | ICD-10-CM

## 2013-11-22 DIAGNOSIS — M19071 Primary osteoarthritis, right ankle and foot: Secondary | ICD-10-CM

## 2013-11-22 DIAGNOSIS — M7750 Other enthesopathy of unspecified foot: Secondary | ICD-10-CM

## 2013-11-22 DIAGNOSIS — G5760 Lesion of plantar nerve, unspecified lower limb: Secondary | ICD-10-CM

## 2013-11-22 DIAGNOSIS — M778 Other enthesopathies, not elsewhere classified: Secondary | ICD-10-CM

## 2013-11-22 DIAGNOSIS — M779 Enthesopathy, unspecified: Secondary | ICD-10-CM

## 2013-11-22 DIAGNOSIS — M19079 Primary osteoarthritis, unspecified ankle and foot: Secondary | ICD-10-CM

## 2013-11-22 MED ORDER — TRIAMCINOLONE ACETONIDE 10 MG/ML IJ SUSP: 10.0000 mg | Freq: Once | INTRAMUSCULAR | Status: AC

## 2013-11-22 MED ORDER — MELOXICAM 15 MG PO TABS
15.0000 mg | ORAL_TABLET | Freq: Every day | ORAL | Status: DC
Start: 2013-11-22 — End: 2015-10-10

## 2013-11-22 NOTE — Progress Notes (Signed)
   Subjective:    Patient ID: [name], male    DOB: 12-11-1945, 68 y.o.   MRN: [id number]  HPI  PT STATED BALL OF THE FOOT IS ACHING AND BURNING ON THE COUPLE TOES FOR 4 MONTHS. THE FOOT IS GETTING WORSE.  THE FOOT GET AGGRAVATED BY SITTING/WALKING. TRIED COLD/HEATING PAD, ALEVE, AND SOMETIMES SOAKING WITH SALT WATER AND IT HELPS SOME.    Review of Systems  Musculoskeletal: Positive for back pain, gait problem and neck pain.  All other systems reviewed and are negative.      Objective:   Physical Exam Is a 68 year old white male well-developed well-nourished oriented x3 patient does have history of arthritis particular his right ankle history of multiple knee surgeries due to arthritis as well. Is having four-month history of pain in the right foot forefoot in particular second third and fourth toes are most affected. Feels like a cramp or a sensation with sharp pain at times. Patient ambulating in sandals utilizing OTC orthotics.  Lower extremity objective findings as follows vascular status is intact pedal pulses palpable DP postal for PT plus one over 4 bilateral capillary refill time 3 seconds all digits skin temperature warm turgor normal no edema rubor pallor noted mild varicosities noted. Neurologically epicritic and proprioceptive sensations intact and symmetric bilateral there is no plantar response DTRs not elicited vibratory sensation is intact there is pain on direct palpation and range of motion with third and fourth toes MTP joints and no significant pain in the second and third interspace on direct lateral compression. X-rays reveal no signs of fracture or osseous abnormality there is calcifications the plantar fascia looked inferior retrocalcaneal spurring and arthrosis of the ankle subtalar joint with asymmetric joint space narrowing being noted. Patient is advised previously may need a joint replacement or fusion 5 or 6 years ago by his orthopedist. Patient continues to  function walks daily for exercise maintains a level surface whenever possible. Neurologically skin color pigment normal hair growth absent nails some thickening brittle discolored friability particular right hallux nail history in the past with topical antifungals for success suggest he try again may be candidate for topical antifungal or possible nail excision per patient request in the future. 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 Also prescription for [facility] is 4 to pharmacy 15 mg is once daily as recommended if needed for pain  Harriet Masson DPM       Assessment & Plan:

## 2013-11-22 NOTE — Patient Instructions (Addendum)
Morton's Neuroma Neuralgia (nerve pain) or neuroma (benign [non-cancerous] nerve tumor) may develop on any interdigital nerve. The interdigital nerves (nerves between digits) of the foot travel beneath and between the metatarsals (long bones of the fore foot) and pass the nerve endings to the toes. The third interdigital is a common place for a small neuroma to form called Morton's neuroma. Another nerve to be affected commonly is the fourth interdigital nerve. This would be in approximately in the area of the base or ball under the bottom of your fourth toe. This condition occurs more commonly in women and is usually on one side. It is usually first noticed by pain radiating (spreading) to the ball of the foot or to the toes. CAUSES The cause of interdigital neuralgia may be from low grade repetitive trauma (damage caused by an accident) as in activities causing a repeated pounding of the foot (running, jumping etc.). It is also caused by improper footwear or recent loss of the fatty padding on the bottom of the foot. TREATMENT  The condition often resolves (goes away) simply with decreasing activity if that is thought to be the cause. Proper shoes are beneficial. Orthotics (special foot support aids) such as a metatarsal bar are often beneficial. This condition usually responds to conservative therapy, however if surgery is necessary it usually brings complete relief. HOME CARE INSTRUCTIONS   Apply ice to the area of soreness for 15-20 minutes, 03-04 times per day, while awake for the first 2 days. Put ice in a plastic bag and place a towel between the bag of ice and your skin.  Only take over-the-counter or prescription medicines for pain, discomfort, or fever as directed by your caregiver. MAKE SURE YOU:   Understand these instructions.  Will watch your condition.  Will get help right away if you are not doing well or get worse. Document Released: 08/11/2000 Document Revised: 07/28/2011  Document Reviewed: 05/05/2005 [facility] Patient Information 2015 [place], [place]. This information is not intended to replace advice given to you by your health care provider. Make sure you discuss any questions you have with your health care provider.   ICE INSTRUCTIONS  Apply ice or cold pack to the affected area at least 3 times a day for 10-15 minutes each time.  You should also use ice after prolonged activity or vigorous exercise.  Do not apply ice longer than 20 minutes at one time.  Always keep a cloth between your skin and the ice pack to prevent burns.  Being consistent and following these instructions will help control your symptoms.  We suggest you purchase a gel ice pack because they are reusable and do bit leak.  Some of them are designed to wrap around the area.  Use the method that works best for you.  Here are some other suggestions for icing.   Use a frozen bag of peas or corn-inexpensive and molds well to your body, usually stays frozen for 10 to 20 minutes.  Wet a towel with cold water and squeeze out the excess until it's damp.  Place in a bag in the freezer for 20 minutes. Then remove and use.

## 2014-06-23 ENCOUNTER — Emergency Department (INDEPENDENT_AMBULATORY_CARE_PROVIDER_SITE_OTHER)
Admission: EM | Admit: 2014-06-23 | Discharge: 2014-06-23 | Disposition: A | Discharge status: Home / Self Care | Payer: PPO | Source: Home / Self Care | Attending: Family Medicine | Admitting: Family Medicine

## 2014-06-23 ENCOUNTER — Encounter (HOSPITAL_COMMUNITY): Payer: Self-pay | Admitting: Emergency Medicine

## 2014-06-23 DIAGNOSIS — J018 Other acute sinusitis: Secondary | ICD-10-CM

## 2014-06-23 MED ORDER — AMOXICILLIN-POT CLAVULANATE 875-125 MG PO TABS: 1.0000 | ORAL_TABLET | Freq: Two times a day (BID) | ORAL | Status: DC

## 2014-06-23 NOTE — ED Notes (Signed)
C/o cold sx onset 1 week Sx include: facial pressure, PND, congestion, runny nose Denies fevers, chills, SOB, wheezing Alert, no signs of acute distress.

## 2014-06-23 NOTE — ED Provider Notes (Signed)
CSN: [id number]     Arrival date & time 06/23/14  1338 History   First MD Initiated Contact with Patient 06/23/14 1421     Chief Complaint  Patient presents with  . URI   (Consider location/radiation/quality/duration/timing/severity/associated sxs/prior Treatment) HPI      69 year old male with history of occasional sinusitis, last occurring about 2 years ago, presents complaining of possible sinus infection. He has nasal congestion, popping in his ears, as well as sinus pressure and has frontal and maxillary sinuses. Symptoms have been getting gradually worse for 1 week. He has been taking his daily Zyrtec without relief. No fever, chills, NVD, cough, chest pain, shortness of breath. No recent travel or sick contacts.  History reviewed. No pertinent past medical history. History reviewed. No pertinent past surgical history. No family history on file. History  Substance Use Topics  . Smoking status: Never Smoker   . Smokeless tobacco: Never Used  . Alcohol Use: No    Review of Systems  Constitutional: Negative for fever and chills.  HENT: Positive for congestion, ear pain, rhinorrhea and sinus pressure.   Respiratory: Negative for cough and shortness of breath.   Cardiovascular: Negative for chest pain.  Gastrointestinal: Negative for nausea, vomiting, abdominal pain and diarrhea.    Allergies  Review of patient's allergies indicates no known allergies.  Home Medications   Prior to Admission medications   Medication Sig Start Date End Date Taking? Authorizing Provider  amoxicillin-clavulanate (AUGMENTIN) 875-125 MG per tablet Take 1 tablet by mouth every 12 (twelve) hours. 06/23/14   Liam Graham, PA-C  meloxicam (MOBIC) 15 MG tablet Take 1 tablet (15 mg total) by mouth daily. 11/22/13   Richard Sikora, DPM   BP 156/103 mmHg  Pulse 94  Temp(Src) 98.4 F (36.9 C) (Oral)  Resp 18  SpO2 96% Physical Exam  Constitutional: He is oriented to person, place, and time. He appears  well-developed and well-nourished. No distress.  HENT:  Head: Normocephalic and atraumatic.  Right Ear: Hearing, tympanic membrane, external ear and ear canal normal.  Left Ear: Hearing, tympanic membrane, external ear and ear canal normal.  Nose: Right sinus exhibits maxillary sinus tenderness and frontal sinus tenderness. Left sinus exhibits maxillary sinus tenderness and frontal sinus tenderness.  Mouth/Throat: Uvula is midline, oropharynx is clear and moist and mucous membranes are normal.  Eyes: Conjunctivae are normal. Right eye exhibits no discharge. Left eye exhibits no discharge.  Neck: Normal range of motion. Neck supple.  Cardiovascular: Normal rate, regular rhythm and normal heart sounds.   Pulmonary/Chest: Effort normal and breath sounds normal. No respiratory distress.  Lymphadenopathy:    He has no cervical adenopathy.  Neurological: He is alert and oriented to person, place, and time. Coordination normal.  Skin: Skin is warm and dry. No rash noted. He is not diaphoretic.  Psychiatric: He has a normal mood and affect. Judgment normal.  Nursing note and vitals reviewed.   ED Course  Procedures (including critical care time) Labs Review Labs Reviewed - No data to display  Imaging Review No results found.   MDM   1. Other acute sinusitis    Treat with Augmentin, continue Zyrtec, and also advised ibuprofen when necessary for sinus pressure. Follow-up if no improvement in a few days   Meds ordered this encounter  Medications  . amoxicillin-clavulanate (AUGMENTIN) 875-125 MG per tablet    Sig: Take 1 tablet by mouth every 12 (twelve) hours.    Dispense:  14 tablet    Refill:  0       Liam Graham, PA-C 06/23/14 1424

## 2014-06-23 NOTE — Discharge Instructions (Signed)

## 2015-07-10 DIAGNOSIS — M1612 Unilateral primary osteoarthritis, left hip: Secondary | ICD-10-CM | POA: Diagnosis not present

## 2015-07-17 DIAGNOSIS — M1612 Unilateral primary osteoarthritis, left hip: Secondary | ICD-10-CM | POA: Diagnosis not present

## 2015-07-23 DIAGNOSIS — C44319 Basal cell carcinoma of skin of other parts of face: Secondary | ICD-10-CM | POA: Diagnosis not present

## 2015-08-07 DIAGNOSIS — M25562 Pain in left knee: Secondary | ICD-10-CM | POA: Diagnosis not present

## 2015-08-07 DIAGNOSIS — M25561 Pain in right knee: Secondary | ICD-10-CM | POA: Diagnosis not present

## 2015-08-07 DIAGNOSIS — M7062 Trochanteric bursitis, left hip: Secondary | ICD-10-CM | POA: Diagnosis not present

## 2015-09-11 DIAGNOSIS — M25551 Pain in right hip: Secondary | ICD-10-CM | POA: Diagnosis not present

## 2015-09-11 DIAGNOSIS — M25552 Pain in left hip: Secondary | ICD-10-CM | POA: Diagnosis not present

## 2015-09-12 ENCOUNTER — Other Ambulatory Visit: Payer: Self-pay | Admitting: Orthopedic Surgery

## 2015-09-12 DIAGNOSIS — R102 Pelvic and perineal pain: Secondary | ICD-10-CM

## 2015-09-21 ENCOUNTER — Other Ambulatory Visit: Payer: PPO

## 2015-09-24 DIAGNOSIS — C44319 Basal cell carcinoma of skin of other parts of face: Secondary | ICD-10-CM | POA: Diagnosis not present

## 2015-09-28 ENCOUNTER — Other Ambulatory Visit: Payer: PPO

## 2015-10-10 ENCOUNTER — Ambulatory Visit (INDEPENDENT_AMBULATORY_CARE_PROVIDER_SITE_OTHER): Payer: PPO | Admitting: Podiatry

## 2015-10-10 ENCOUNTER — Ambulatory Visit (INDEPENDENT_AMBULATORY_CARE_PROVIDER_SITE_OTHER): Payer: PPO

## 2015-10-10 ENCOUNTER — Encounter: Payer: Self-pay | Admitting: Podiatry

## 2015-10-10 VITALS — BP 148/75 | HR 68 | Resp 16

## 2015-10-10 DIAGNOSIS — L600 Ingrowing nail: Secondary | ICD-10-CM

## 2015-10-10 DIAGNOSIS — M2041 Other hammer toe(s) (acquired), right foot: Secondary | ICD-10-CM

## 2015-10-10 DIAGNOSIS — M79671 Pain in right foot: Secondary | ICD-10-CM | POA: Diagnosis not present

## 2015-10-10 DIAGNOSIS — M7751 Other enthesopathy of right foot: Secondary | ICD-10-CM | POA: Diagnosis not present

## 2015-10-10 DIAGNOSIS — M779 Enthesopathy, unspecified: Secondary | ICD-10-CM

## 2015-10-10 DIAGNOSIS — M778 Other enthesopathies, not elsewhere classified: Secondary | ICD-10-CM

## 2015-10-10 MED ORDER — TRIAMCINOLONE ACETONIDE 10 MG/ML IJ SUSP
10.0000 mg | Freq: Once | INTRAMUSCULAR | Status: AC
  Administered 2015-10-10: 10 mg

## 2015-10-11 NOTE — Progress Notes (Signed)
Subjective:     Patient ID: [name], male   DOB: December 07, 1945, 70 y.o.   MRN: [id number]  HPI patient points to the right foot stating the second and third metatarsal phalangeal joints of been sore and inflamed. Patient states that this is been going on for the last couple months and I been doing well and he also has digital deformities   Review of Systems     Objective:   Physical Exam Neurovascular status found to be intact muscle strength adequate range of motion within normal limits with patient found to have inflammation and fluid around the second and third metatarsophalangeal joints with pain right foot. Patient's found to have good digital perfusion well oriented 3    Assessment:     Inflammatory capsulitis second third metatarsalgia we'll joints with rigid hammertoe deformity noted    Plan:     H&P and x-rays reviewed. Today I did a proximal block I then went ahead and aspirated the second and third MPJs and carefully injected the joints with 3 mg dexamethasone Kenalog to reduce inflammation and applied thick plantar padding to reduce pressure against the joint surfaces. Discussed possible digital fusion and explained to patient that we may have to do this long-term and patient be reevaluated again in the next several weeks. Also discussed removal of the right hallux nail which she can have done at next visit  X-ray report indicates moderate rigid contracture the digits with no indications of stress fracture arthritis

## 2015-10-24 ENCOUNTER — Ambulatory Visit: Payer: PPO | Admitting: Podiatry

## 2015-10-27 ENCOUNTER — Ambulatory Visit
Admission: RE | Admit: 2015-10-27 | Discharge: 2015-10-27 | Disposition: A | Discharge status: Home / Self Care | Payer: PPO | Source: Ambulatory Visit | Attending: Orthopedic Surgery | Admitting: Orthopedic Surgery

## 2015-10-27 DIAGNOSIS — R102 Pelvic and perineal pain: Secondary | ICD-10-CM

## 2015-10-27 DIAGNOSIS — S76312A Strain of muscle, fascia and tendon of the posterior muscle group at thigh level, left thigh, initial encounter: Secondary | ICD-10-CM | POA: Diagnosis not present

## 2015-10-30 DIAGNOSIS — M545 Low back pain: Secondary | ICD-10-CM | POA: Diagnosis not present

## 2015-11-27 DIAGNOSIS — M533 Sacrococcygeal disorders, not elsewhere classified: Secondary | ICD-10-CM | POA: Diagnosis not present

## 2015-11-30 DIAGNOSIS — K029 Dental caries, unspecified: Secondary | ICD-10-CM | POA: Diagnosis not present

## 2015-11-30 DIAGNOSIS — M5136 Other intervertebral disc degeneration, lumbar region: Secondary | ICD-10-CM | POA: Diagnosis not present

## 2015-11-30 DIAGNOSIS — C439 Malignant melanoma of skin, unspecified: Secondary | ICD-10-CM | POA: Diagnosis not present

## 2015-11-30 DIAGNOSIS — I10 Essential (primary) hypertension: Secondary | ICD-10-CM | POA: Diagnosis not present

## 2015-11-30 DIAGNOSIS — M129 Arthropathy, unspecified: Secondary | ICD-10-CM | POA: Diagnosis not present

## 2015-12-11 DIAGNOSIS — M25512 Pain in left shoulder: Secondary | ICD-10-CM | POA: Diagnosis not present

## 2015-12-11 DIAGNOSIS — M722 Plantar fascial fibromatosis: Secondary | ICD-10-CM

## 2015-12-11 DIAGNOSIS — M533 Sacrococcygeal disorders, not elsewhere classified: Secondary | ICD-10-CM | POA: Diagnosis not present

## 2016-02-04 DIAGNOSIS — D225 Melanocytic nevi of trunk: Secondary | ICD-10-CM | POA: Diagnosis not present

## 2016-02-04 DIAGNOSIS — L570 Actinic keratosis: Secondary | ICD-10-CM | POA: Diagnosis not present

## 2016-02-04 DIAGNOSIS — D1801 Hemangioma of skin and subcutaneous tissue: Secondary | ICD-10-CM | POA: Diagnosis not present

## 2016-02-04 DIAGNOSIS — L814 Other melanin hyperpigmentation: Secondary | ICD-10-CM | POA: Diagnosis not present

## 2016-02-04 DIAGNOSIS — Z85828 Personal history of other malignant neoplasm of skin: Secondary | ICD-10-CM | POA: Diagnosis not present

## 2016-03-26 DIAGNOSIS — H47292 Other optic atrophy, left eye: Secondary | ICD-10-CM | POA: Diagnosis not present

## 2016-08-04 DIAGNOSIS — L570 Actinic keratosis: Secondary | ICD-10-CM | POA: Diagnosis not present

## 2016-08-04 DIAGNOSIS — Z85828 Personal history of other malignant neoplasm of skin: Secondary | ICD-10-CM | POA: Diagnosis not present

## 2016-08-04 DIAGNOSIS — D225 Melanocytic nevi of trunk: Secondary | ICD-10-CM | POA: Diagnosis not present

## 2016-08-04 DIAGNOSIS — L905 Scar conditions and fibrosis of skin: Secondary | ICD-10-CM | POA: Diagnosis not present

## 2016-11-11 DIAGNOSIS — Z96651 Presence of right artificial knee joint: Secondary | ICD-10-CM | POA: Diagnosis not present

## 2016-11-11 DIAGNOSIS — M25562 Pain in left knee: Secondary | ICD-10-CM | POA: Diagnosis not present

## 2016-11-11 DIAGNOSIS — Z96652 Presence of left artificial knee joint: Secondary | ICD-10-CM | POA: Diagnosis not present

## 2016-11-11 DIAGNOSIS — M1611 Unilateral primary osteoarthritis, right hip: Secondary | ICD-10-CM | POA: Diagnosis not present

## 2016-11-11 DIAGNOSIS — Z09 Encounter for follow-up examination after completed treatment for conditions other than malignant neoplasm: Secondary | ICD-10-CM | POA: Diagnosis not present

## 2016-11-11 DIAGNOSIS — M1612 Unilateral primary osteoarthritis, left hip: Secondary | ICD-10-CM | POA: Diagnosis not present

## 2016-11-17 DIAGNOSIS — M1611 Unilateral primary osteoarthritis, right hip: Secondary | ICD-10-CM | POA: Diagnosis not present

## 2016-11-26 DIAGNOSIS — K645 Perianal venous thrombosis: Secondary | ICD-10-CM | POA: Diagnosis not present

## 2016-12-01 DIAGNOSIS — M79605 Pain in left leg: Secondary | ICD-10-CM | POA: Diagnosis not present

## 2016-12-05 DIAGNOSIS — K645 Perianal venous thrombosis: Secondary | ICD-10-CM | POA: Diagnosis not present

## 2016-12-25 DIAGNOSIS — M79605 Pain in left leg: Secondary | ICD-10-CM | POA: Diagnosis not present

## 2016-12-31 DIAGNOSIS — M79605 Pain in left leg: Secondary | ICD-10-CM | POA: Diagnosis not present

## 2017-01-05 DIAGNOSIS — M79605 Pain in left leg: Secondary | ICD-10-CM | POA: Diagnosis not present

## 2017-01-12 DIAGNOSIS — M79605 Pain in left leg: Secondary | ICD-10-CM | POA: Diagnosis not present

## 2017-01-15 DIAGNOSIS — M79605 Pain in left leg: Secondary | ICD-10-CM | POA: Diagnosis not present

## 2017-01-22 DIAGNOSIS — M79605 Pain in left leg: Secondary | ICD-10-CM | POA: Diagnosis not present

## 2017-01-26 DIAGNOSIS — M79605 Pain in left leg: Secondary | ICD-10-CM | POA: Diagnosis not present

## 2017-01-28 DIAGNOSIS — M79605 Pain in left leg: Secondary | ICD-10-CM | POA: Diagnosis not present

## 2017-02-02 DIAGNOSIS — M79605 Pain in left leg: Secondary | ICD-10-CM | POA: Diagnosis not present

## 2017-02-05 DIAGNOSIS — M25552 Pain in left hip: Secondary | ICD-10-CM | POA: Diagnosis not present

## 2017-04-15 DIAGNOSIS — L309 Dermatitis, unspecified: Secondary | ICD-10-CM | POA: Diagnosis not present

## 2017-04-15 DIAGNOSIS — L578 Other skin changes due to chronic exposure to nonionizing radiation: Secondary | ICD-10-CM | POA: Diagnosis not present

## 2017-04-15 DIAGNOSIS — L570 Actinic keratosis: Secondary | ICD-10-CM | POA: Diagnosis not present

## 2017-07-09 DIAGNOSIS — M7061 Trochanteric bursitis, right hip: Secondary | ICD-10-CM | POA: Diagnosis not present

## 2017-07-16 DIAGNOSIS — L578 Other skin changes due to chronic exposure to nonionizing radiation: Secondary | ICD-10-CM | POA: Diagnosis not present

## 2017-07-16 DIAGNOSIS — L570 Actinic keratosis: Secondary | ICD-10-CM | POA: Diagnosis not present

## 2017-08-11 DIAGNOSIS — M25551 Pain in right hip: Secondary | ICD-10-CM | POA: Diagnosis not present

## 2017-08-17 DIAGNOSIS — M533 Sacrococcygeal disorders, not elsewhere classified: Secondary | ICD-10-CM | POA: Diagnosis not present

## 2017-08-18 DIAGNOSIS — M533 Sacrococcygeal disorders, not elsewhere classified: Secondary | ICD-10-CM | POA: Diagnosis not present

## 2017-10-06 DIAGNOSIS — M25551 Pain in right hip: Secondary | ICD-10-CM | POA: Diagnosis not present

## 2017-10-19 DIAGNOSIS — M545 Low back pain: Secondary | ICD-10-CM | POA: Diagnosis not present

## 2017-10-26 DIAGNOSIS — M461 Sacroiliitis, not elsewhere classified: Secondary | ICD-10-CM | POA: Diagnosis not present

## 2017-10-26 DIAGNOSIS — M545 Low back pain: Secondary | ICD-10-CM | POA: Diagnosis not present

## 2017-10-29 DIAGNOSIS — M545 Low back pain: Secondary | ICD-10-CM | POA: Diagnosis not present

## 2017-10-29 DIAGNOSIS — M461 Sacroiliitis, not elsewhere classified: Secondary | ICD-10-CM | POA: Diagnosis not present

## 2017-11-03 DIAGNOSIS — M461 Sacroiliitis, not elsewhere classified: Secondary | ICD-10-CM | POA: Diagnosis not present

## 2017-11-03 DIAGNOSIS — M545 Low back pain: Secondary | ICD-10-CM | POA: Diagnosis not present

## 2017-11-05 DIAGNOSIS — M461 Sacroiliitis, not elsewhere classified: Secondary | ICD-10-CM | POA: Diagnosis not present

## 2017-11-05 DIAGNOSIS — M545 Low back pain: Secondary | ICD-10-CM | POA: Diagnosis not present

## 2017-11-12 DIAGNOSIS — M461 Sacroiliitis, not elsewhere classified: Secondary | ICD-10-CM | POA: Diagnosis not present

## 2017-11-12 DIAGNOSIS — M545 Low back pain: Secondary | ICD-10-CM | POA: Diagnosis not present

## 2017-11-16 DIAGNOSIS — M461 Sacroiliitis, not elsewhere classified: Secondary | ICD-10-CM | POA: Diagnosis not present

## 2017-11-16 DIAGNOSIS — M545 Low back pain: Secondary | ICD-10-CM | POA: Diagnosis not present

## 2017-11-25 DIAGNOSIS — M545 Low back pain: Secondary | ICD-10-CM | POA: Diagnosis not present

## 2017-11-25 DIAGNOSIS — M461 Sacroiliitis, not elsewhere classified: Secondary | ICD-10-CM | POA: Diagnosis not present

## 2018-03-26 DIAGNOSIS — M26622 Arthralgia of left temporomandibular joint: Secondary | ICD-10-CM | POA: Diagnosis not present

## 2018-03-26 DIAGNOSIS — H9202 Otalgia, left ear: Secondary | ICD-10-CM | POA: Diagnosis not present

## 2018-12-26 ENCOUNTER — Inpatient Hospital Stay (HOSPITAL_COMMUNITY): Payer: Medicare Other

## 2018-12-26 ENCOUNTER — Emergency Department (HOSPITAL_COMMUNITY): Payer: Medicare Other

## 2018-12-26 ENCOUNTER — Encounter (HOSPITAL_COMMUNITY): Payer: Self-pay | Admitting: Cardiology

## 2018-12-26 ENCOUNTER — Other Ambulatory Visit: Payer: Self-pay

## 2018-12-26 ENCOUNTER — Inpatient Hospital Stay (HOSPITAL_COMMUNITY)
Admission: EM | Admit: 2018-12-26 | Discharge: 2019-01-18 | DRG: 308 | Disposition: E | Payer: Medicare Other | Attending: Pulmonary Disease | Admitting: Pulmonary Disease

## 2018-12-26 DIAGNOSIS — Z6830 Body mass index (BMI) 30.0-30.9, adult: Secondary | ICD-10-CM

## 2018-12-26 DIAGNOSIS — Z20828 Contact with and (suspected) exposure to other viral communicable diseases: Secondary | ICD-10-CM | POA: Diagnosis present

## 2018-12-26 DIAGNOSIS — G253 Myoclonus: Secondary | ICD-10-CM | POA: Diagnosis present

## 2018-12-26 DIAGNOSIS — I442 Atrioventricular block, complete: Secondary | ICD-10-CM | POA: Diagnosis present

## 2018-12-26 DIAGNOSIS — S0101XA Laceration without foreign body of scalp, initial encounter: Secondary | ICD-10-CM | POA: Diagnosis present

## 2018-12-26 DIAGNOSIS — Y92414 Local residential or business street as the place of occurrence of the external cause: Secondary | ICD-10-CM

## 2018-12-26 DIAGNOSIS — R402432 Glasgow coma scale score 3-8, at arrival to emergency department: Secondary | ICD-10-CM | POA: Diagnosis present

## 2018-12-26 DIAGNOSIS — Z23 Encounter for immunization: Secondary | ICD-10-CM

## 2018-12-26 DIAGNOSIS — I472 Ventricular tachycardia: Principal | ICD-10-CM | POA: Diagnosis present

## 2018-12-26 DIAGNOSIS — G935 Compression of brain: Secondary | ICD-10-CM | POA: Diagnosis present

## 2018-12-26 DIAGNOSIS — Z66 Do not resuscitate: Secondary | ICD-10-CM | POA: Diagnosis not present

## 2018-12-26 DIAGNOSIS — Z7189 Other specified counseling: Secondary | ICD-10-CM

## 2018-12-26 DIAGNOSIS — J9600 Acute respiratory failure, unspecified whether with hypoxia or hypercapnia: Secondary | ICD-10-CM | POA: Diagnosis present

## 2018-12-26 DIAGNOSIS — I361 Nonrheumatic tricuspid (valve) insufficiency: Secondary | ICD-10-CM

## 2018-12-26 DIAGNOSIS — G931 Anoxic brain damage, not elsewhere classified: Secondary | ICD-10-CM | POA: Diagnosis present

## 2018-12-26 DIAGNOSIS — S50812A Abrasion of left forearm, initial encounter: Secondary | ICD-10-CM | POA: Diagnosis present

## 2018-12-26 DIAGNOSIS — S80212A Abrasion, left knee, initial encounter: Secondary | ICD-10-CM | POA: Diagnosis present

## 2018-12-26 DIAGNOSIS — I469 Cardiac arrest, cause unspecified: Secondary | ICD-10-CM | POA: Diagnosis not present

## 2018-12-26 DIAGNOSIS — Z515 Encounter for palliative care: Secondary | ICD-10-CM | POA: Diagnosis not present

## 2018-12-26 DIAGNOSIS — S60511A Abrasion of right hand, initial encounter: Secondary | ICD-10-CM | POA: Diagnosis present

## 2018-12-26 DIAGNOSIS — I9589 Other hypotension: Secondary | ICD-10-CM | POA: Diagnosis present

## 2018-12-26 DIAGNOSIS — E872 Acidosis: Secondary | ICD-10-CM | POA: Diagnosis present

## 2018-12-26 DIAGNOSIS — R570 Cardiogenic shock: Secondary | ICD-10-CM | POA: Diagnosis present

## 2018-12-26 DIAGNOSIS — S2242XA Multiple fractures of ribs, left side, initial encounter for closed fracture: Secondary | ICD-10-CM | POA: Diagnosis present

## 2018-12-26 DIAGNOSIS — R579 Shock, unspecified: Secondary | ICD-10-CM

## 2018-12-26 DIAGNOSIS — I4901 Ventricular fibrillation: Secondary | ICD-10-CM | POA: Diagnosis present

## 2018-12-26 DIAGNOSIS — S301XXA Contusion of abdominal wall, initial encounter: Secondary | ICD-10-CM | POA: Diagnosis present

## 2018-12-26 DIAGNOSIS — E669 Obesity, unspecified: Secondary | ICD-10-CM | POA: Diagnosis present

## 2018-12-26 DIAGNOSIS — E1165 Type 2 diabetes mellitus with hyperglycemia: Secondary | ICD-10-CM | POA: Diagnosis not present

## 2018-12-26 DIAGNOSIS — J9601 Acute respiratory failure with hypoxia: Secondary | ICD-10-CM

## 2018-12-26 DIAGNOSIS — Z791 Long term (current) use of non-steroidal anti-inflammatories (NSAID): Secondary | ICD-10-CM

## 2018-12-26 DIAGNOSIS — G936 Cerebral edema: Secondary | ICD-10-CM | POA: Diagnosis present

## 2018-12-26 HISTORY — DX: Unspecified osteoarthritis, unspecified site: M19.90

## 2018-12-26 LAB — COMPREHENSIVE METABOLIC PANEL
ALT: 118 U/L — ABNORMAL HIGH (ref 0–44)
ALT: 160 U/L — ABNORMAL HIGH (ref 0–44)
AST: 132 U/L — ABNORMAL HIGH (ref 15–41)
AST: 212 U/L — ABNORMAL HIGH (ref 15–41)
Albumin: 3.7 g/dL (ref 3.5–5.0)
Albumin: 3.7 g/dL (ref 3.5–5.0)
Alkaline Phosphatase: 73 U/L (ref 38–126)
Alkaline Phosphatase: 80 U/L (ref 38–126)
Anion gap: 22 — ABNORMAL HIGH (ref 5–15)
Anion gap: 12 (ref 5–15)
BUN: 18 mg/dL (ref 8–23)
BUN: 18 mg/dL (ref 8–23)
CO2: 18 mmol/L — ABNORMAL LOW (ref 22–32)
CO2: 16 mmol/L — ABNORMAL LOW (ref 22–32)
Calcium: 9.6 mg/dL (ref 8.9–10.3)
Calcium: 7.9 mg/dL — ABNORMAL LOW (ref 8.9–10.3)
Chloride: 98 mmol/L (ref 98–111)
Chloride: 106 mmol/L (ref 98–111)
Creatinine, Ser: 1.29 mg/dL — ABNORMAL HIGH (ref 0.61–1.24)
Creatinine, Ser: 1.11 mg/dL (ref 0.61–1.24)
GFR calc Af Amer: >60 mL/min (ref >60)
GFR calc Af Amer: >60 mL/min (ref >60)
GFR calc non Af Amer: 55 mL/min — ABNORMAL LOW (ref >60)
GFR calc non Af Amer: >60 mL/min (ref >60)
Glucose, Bld: 398 mg/dL — ABNORMAL HIGH (ref 70–99)
Glucose, Bld: 367 mg/dL — ABNORMAL HIGH (ref 70–99)
Potassium: 3.3 mmol/L — ABNORMAL LOW (ref 3.5–5.1)
Potassium: 6.5 mmol/L (ref 3.5–5.1)
Sodium: 138 mmol/L (ref 135–145)
Sodium: 134 mmol/L — ABNORMAL LOW (ref 135–145)
Total Bilirubin: 1 mg/dL (ref 0.3–1.2)
Total Bilirubin: 0.9 mg/dL (ref 0.3–1.2)
Total Protein: 6.1 g/dL — ABNORMAL LOW (ref 6.5–8.1)
Total Protein: 6.1 g/dL — ABNORMAL LOW (ref 6.5–8.1)

## 2018-12-26 LAB — URINALYSIS, ROUTINE W REFLEX MICROSCOPIC
Bilirubin Urine: NEGATIVE
Glucose, UA: >=500 mg/dL — AB
Ketones, ur: NEGATIVE mg/dL
Leukocytes,Ua: NEGATIVE
Nitrite: NEGATIVE
Protein, ur: 100 mg/dL — AB
Specific Gravity, Urine: 1.02 (ref 1.005–1.030)
pH: 6 (ref 5.0–8.0)

## 2018-12-26 LAB — LACTIC ACID, PLASMA
Lactic Acid, Venous: >11 mmol/L (ref 0.5–1.9)
Lactic Acid, Venous: 4.6 mmol/L (ref 0.5–1.9)
Lactic Acid, Venous: 8.7 mmol/L (ref 0.5–1.9)
Lactic Acid, Venous: 8.4 mmol/L (ref 0.5–1.9)

## 2018-12-26 LAB — CBC
HCT: 47.9 % (ref 39.0–52.0)
HCT: 48.3 % (ref 39.0–52.0)
Hemoglobin: 15.2 g/dL (ref 13.0–17.0)
Hemoglobin: 16.5 g/dL (ref 13.0–17.0)
MCH: 33.4 pg (ref 26.0–34.0)
MCH: 32.6 pg (ref 26.0–34.0)
MCHC: 31.7 g/dL (ref 30.0–36.0)
MCHC: 34.2 g/dL (ref 30.0–36.0)
MCV: 105.3 fL — ABNORMAL HIGH (ref 80.0–100.0)
MCV: 95.5 fL (ref 80.0–100.0)
Platelets: 161 10*3/uL (ref 150–400)
Platelets: 141 10*3/uL — ABNORMAL LOW (ref 150–400)
RBC: 4.55 MIL/uL (ref 4.22–5.81)
RBC: 5.06 MIL/uL (ref 4.22–5.81)
RDW: 12.3 % (ref 11.5–15.5)
RDW: 13.1 % (ref 11.5–15.5)
WBC: 11 10*3/uL — ABNORMAL HIGH (ref 4.0–10.5)
WBC: 26.2 10*3/uL — ABNORMAL HIGH (ref 4.0–10.5)
nRBC: 0 % (ref 0.0–0.2)
nRBC: 0 % (ref 0.0–0.2)

## 2018-12-26 LAB — BPAM FFP
Blood Product Expiration Date: 202008132359
Blood Product Expiration Date: 202008152359
ISSUE DATE / TIME: 202008090913
ISSUE DATE / TIME: 202008090913
Unit Type and Rh: 600
Unit Type and Rh: 6200

## 2018-12-26 LAB — POCT I-STAT 7, (LYTES, BLD GAS, ICA,H+H)
Acid-base deficit: 10 mmol/L — ABNORMAL HIGH (ref 0.0–2.0)
Acid-base deficit: 10 mmol/L — ABNORMAL HIGH (ref 0.0–2.0)
Acid-base deficit: 10 mmol/L — ABNORMAL HIGH (ref 0.0–2.0)
Acid-base deficit: 11 mmol/L — ABNORMAL HIGH (ref 0.0–2.0)
Bicarbonate: 17.4 mmol/L — ABNORMAL LOW (ref 20.0–28.0)
Bicarbonate: 15.7 mmol/L — ABNORMAL LOW (ref 20.0–28.0)
Bicarbonate: 15.9 mmol/L — ABNORMAL LOW (ref 20.0–28.0)
Bicarbonate: 14.1 mmol/L — ABNORMAL LOW (ref 20.0–28.0)
Calcium, Ion: 1.05 mmol/L — ABNORMAL LOW (ref 1.15–1.40)
Calcium, Ion: 1.13 mmol/L — ABNORMAL LOW (ref 1.15–1.40)
Calcium, Ion: 1.06 mmol/L — ABNORMAL LOW (ref 1.15–1.40)
Calcium, Ion: 1.06 mmol/L — ABNORMAL LOW (ref 1.15–1.40)
HCT: 44 % (ref 39.0–52.0)
HCT: 48 % (ref 39.0–52.0)
HCT: 48 % (ref 39.0–52.0)
HCT: 49 % (ref 39.0–52.0)
Hemoglobin: 15 g/dL (ref 13.0–17.0)
Hemoglobin: 16.3 g/dL (ref 13.0–17.0)
Hemoglobin: 16.3 g/dL (ref 13.0–17.0)
Hemoglobin: 16.7 g/dL (ref 13.0–17.0)
O2 Saturation: 100 %
O2 Saturation: 100 %
O2 Saturation: 100 %
O2 Saturation: 100 %
Patient temperature: 98.2
Patient temperature: 37
Patient temperature: 36.9
Potassium: 6.7 mmol/L (ref 3.5–5.1)
Potassium: 4.5 mmol/L (ref 3.5–5.1)
Potassium: 3.3 mmol/L — ABNORMAL LOW (ref 3.5–5.1)
Potassium: 3.7 mmol/L (ref 3.5–5.1)
Sodium: 135 mmol/L (ref 135–145)
Sodium: 137 mmol/L (ref 135–145)
Sodium: 142 mmol/L (ref 135–145)
Sodium: 141 mmol/L (ref 135–145)
TCO2: 19 mmol/L — ABNORMAL LOW (ref 22–32)
TCO2: 17 mmol/L — ABNORMAL LOW (ref 22–32)
TCO2: 17 mmol/L — ABNORMAL LOW (ref 22–32)
TCO2: 15 mmol/L — ABNORMAL LOW (ref 22–32)
pCO2 arterial: 40.4 mmHg (ref 32.0–48.0)
pCO2 arterial: 33.7 mmHg (ref 32.0–48.0)
pCO2 arterial: 35.6 mmHg (ref 32.0–48.0)
pCO2 arterial: 29 mmHg — ABNORMAL LOW (ref 32.0–48.0)
pH, Arterial: 7.241 — ABNORMAL LOW (ref 7.350–7.450)
pH, Arterial: 7.276 — ABNORMAL LOW (ref 7.350–7.450)
pH, Arterial: 7.257 — ABNORMAL LOW (ref 7.350–7.450)
pH, Arterial: 7.293 — ABNORMAL LOW (ref 7.350–7.450)
pO2, Arterial: 238 mmHg — ABNORMAL HIGH (ref 83.0–108.0)
pO2, Arterial: 189 mmHg — ABNORMAL HIGH (ref 83.0–108.0)
pO2, Arterial: 232 mmHg — ABNORMAL HIGH (ref 83.0–108.0)
pO2, Arterial: 229 mmHg — ABNORMAL HIGH (ref 83.0–108.0)

## 2018-12-26 LAB — BASIC METABOLIC PANEL
Anion gap: 16 — ABNORMAL HIGH (ref 5–15)
BUN: 21 mg/dL (ref 8–23)
CO2: 17 mmol/L — ABNORMAL LOW (ref 22–32)
Calcium: 7.5 mg/dL — ABNORMAL LOW (ref 8.9–10.3)
Chloride: 108 mmol/L (ref 98–111)
Creatinine, Ser: 1.14 mg/dL (ref 0.61–1.24)
GFR calc Af Amer: >60 mL/min (ref >60)
GFR calc non Af Amer: >60 mL/min (ref >60)
Glucose, Bld: 370 mg/dL — ABNORMAL HIGH (ref 70–99)
Potassium: 3.6 mmol/L (ref 3.5–5.1)
Sodium: 141 mmol/L (ref 135–145)

## 2018-12-26 LAB — SARS CORONAVIRUS 2 BY RT PCR (HOSPITAL ORDER, PERFORMED IN ~~LOC~~ HOSPITAL LAB): SARS Coronavirus 2: NEGATIVE

## 2018-12-26 LAB — CBC WITH DIFFERENTIAL/PLATELET
Abs Immature Granulocytes: 0.46 10*3/uL — ABNORMAL HIGH (ref 0.00–0.07)
Basophils Absolute: 0.1 10*3/uL (ref 0.0–0.1)
Basophils Relative: 0 %
Eosinophils Absolute: 0 10*3/uL (ref 0.0–0.5)
Eosinophils Relative: 0 %
HCT: 49.5 % (ref 39.0–52.0)
Hemoglobin: 16.9 g/dL (ref 13.0–17.0)
Immature Granulocytes: 2 %
Lymphocytes Relative: 5 %
Lymphs Abs: 1 10*3/uL (ref 0.7–4.0)
MCH: 32.5 pg (ref 26.0–34.0)
MCHC: 34.1 g/dL (ref 30.0–36.0)
MCV: 95.2 fL (ref 80.0–100.0)
Monocytes Absolute: 2.2 10*3/uL — ABNORMAL HIGH (ref 0.1–1.0)
Monocytes Relative: 11 %
Neutro Abs: 17.3 10*3/uL — ABNORMAL HIGH (ref 1.7–7.7)
Neutrophils Relative %: 82 %
Platelets: 164 10*3/uL (ref 150–400)
RBC: 5.2 MIL/uL (ref 4.22–5.81)
RDW: 12.7 % (ref 11.5–15.5)
WBC: 21.1 10*3/uL — ABNORMAL HIGH (ref 4.0–10.5)
nRBC: 0 % (ref 0.0–0.2)

## 2018-12-26 LAB — MAGNESIUM: Magnesium: 1.6 mg/dL — ABNORMAL LOW (ref 1.7–2.4)

## 2018-12-26 LAB — I-STAT CHEM 8, ED
BUN: 20 mg/dL (ref 8–23)
Calcium, Ion: 1.18 mmol/L (ref 1.15–1.40)
Chloride: 100 mmol/L (ref 98–111)
Creatinine, Ser: 1.1 mg/dL (ref 0.61–1.24)
Glucose, Bld: 387 mg/dL — ABNORMAL HIGH (ref 70–99)
HCT: 46 % (ref 39.0–52.0)
Hemoglobin: 15.6 g/dL (ref 13.0–17.0)
Potassium: 3.2 mmol/L — ABNORMAL LOW (ref 3.5–5.1)
Sodium: 136 mmol/L (ref 135–145)
TCO2: 21 mmol/L — ABNORMAL LOW (ref 22–32)

## 2018-12-26 LAB — LIPASE, BLOOD: Lipase: 63 U/L — ABNORMAL HIGH (ref 11–51)

## 2018-12-26 LAB — ECHOCARDIOGRAM COMPLETE
Height: 71 in
Weight: 3520 oz

## 2018-12-26 LAB — PROCALCITONIN: Procalcitonin: 0.33 ng/mL

## 2018-12-26 LAB — TROPONIN I (HIGH SENSITIVITY)
Troponin I (High Sensitivity): 12 ng/L (ref <18)
Troponin I (High Sensitivity): 60 ng/L — ABNORMAL HIGH (ref <18)
Troponin I (High Sensitivity): 326 ng/L (ref <18)

## 2018-12-26 LAB — BLOOD PRODUCT ORDER (VERBAL) VERIFICATION

## 2018-12-26 LAB — PREPARE FRESH FROZEN PLASMA
Unit division: 0
Unit division: 0

## 2018-12-26 LAB — APTT: aPTT: 28 seconds (ref 24–36)

## 2018-12-26 LAB — RAPID URINE DRUG SCREEN, HOSP PERFORMED
Amphetamines: NOT DETECTED
Barbiturates: NOT DETECTED
Benzodiazepines: NOT DETECTED
Cocaine: NOT DETECTED
Opiates: NOT DETECTED
Tetrahydrocannabinol: NOT DETECTED

## 2018-12-26 LAB — ETHANOL: Alcohol, Ethyl (B): <10 mg/dL (ref <10)

## 2018-12-26 LAB — PHOSPHORUS: Phosphorus: 3.5 mg/dL (ref 2.5–4.6)

## 2018-12-26 LAB — PROTIME-INR
INR: 1.2 (ref 0.8–1.2)
INR: 1.2 (ref 0.8–1.2)
Prothrombin Time: 15.2 seconds (ref 11.4–15.2)
Prothrombin Time: 15.2 seconds (ref 11.4–15.2)

## 2018-12-26 LAB — CORTISOL: Cortisol, Plasma: 45.4 ug/dL

## 2018-12-26 LAB — AMYLASE: Amylase: 105 U/L — ABNORMAL HIGH (ref 28–100)

## 2018-12-26 LAB — URINALYSIS, MICROSCOPIC (REFLEX): WBC, UA: NONE SEEN WBC/hpf (ref 0–5)

## 2018-12-26 LAB — STREP PNEUMONIAE URINARY ANTIGEN: Strep Pneumo Urinary Antigen: NEGATIVE

## 2018-12-26 LAB — CDS SEROLOGY

## 2018-12-26 LAB — MRSA PCR SCREENING: MRSA by PCR: NEGATIVE

## 2018-12-26 LAB — ABO/RH: ABO/RH(D): O POS

## 2018-12-26 MED ORDER — SODIUM CHLORIDE 0.9 % IV SOLN
INTRAVENOUS | Status: AC | PRN
  Administered 2018-12-26 (×2): 1000 mL via INTRAVENOUS

## 2018-12-26 MED ORDER — NOREPINEPHRINE 4 MG/250ML-% IV SOLN
INTRAVENOUS | Status: AC
  Filled 2018-12-26: qty 250

## 2018-12-26 MED ORDER — PANTOPRAZOLE SODIUM 40 MG IV SOLR
40.0000 mg | Freq: Every day | INTRAVENOUS | Status: DC
  Administered 2018-12-26: 40 mg via INTRAVENOUS
  Filled 2018-12-26: qty 40

## 2018-12-26 MED ORDER — SODIUM CHLORIDE 0.9 % IV SOLN
INTRAVENOUS | Status: DC
  Administered 2018-12-26 – 2018-12-27 (×5): via INTRAVENOUS

## 2018-12-26 MED ORDER — LEVETIRACETAM IN NACL 1000 MG/100ML IV SOLN
1000.0000 mg | INTRAVENOUS | Status: AC
  Administered 2018-12-26 (×2): 1000 mg via INTRAVENOUS
  Filled 2018-12-26: qty 100

## 2018-12-26 MED ORDER — NOREPINEPHRINE 16 MG/250ML-% IV SOLN
5.0000 ug/min | INTRAVENOUS | Status: DC
  Administered 2018-12-26: 28 ug/min via INTRAVENOUS
  Administered 2018-12-27: 23 ug/min via INTRAVENOUS
  Filled 2018-12-26 (×3): qty 250

## 2018-12-26 MED ORDER — IOHEXOL 300 MG/ML  SOLN
100.0000 mL | Freq: Once | INTRAMUSCULAR | Status: AC | PRN
  Administered 2018-12-26: 11:00:00 100 mL via INTRAVENOUS

## 2018-12-26 MED ORDER — LORAZEPAM 2 MG/ML IJ SOLN
4.0000 mg | Freq: Once | INTRAMUSCULAR | Status: AC
  Administered 2018-12-26: 4 mg via INTRAVENOUS
  Filled 2018-12-26: qty 2

## 2018-12-26 MED ORDER — ORAL CARE MOUTH RINSE
15.0000 mL | OROMUCOSAL | Status: DC
  Administered 2018-12-26 – 2018-12-27 (×11): 15 mL via OROMUCOSAL

## 2018-12-26 MED ORDER — NOREPINEPHRINE 4 MG/250ML-% IV SOLN
5.0000 ug/min | INTRAVENOUS | Status: DC
  Administered 2018-12-26: 13:00:00 16 ug/min via INTRAVENOUS
  Administered 2018-12-26: 32 ug/min via INTRAVENOUS
  Administered 2018-12-26: 5 ug/min via INTRAVENOUS
  Filled 2018-12-26: qty 250

## 2018-12-26 MED ORDER — CHLORHEXIDINE GLUCONATE 0.12% ORAL RINSE (MEDLINE KIT)
15.0000 mL | Freq: Two times a day (BID) | OROMUCOSAL | Status: DC
  Administered 2018-12-26 – 2018-12-27 (×2): 15 mL via OROMUCOSAL

## 2018-12-26 MED ORDER — SODIUM BICARBONATE 8.4 % IV SOLN
INTRAVENOUS | Status: DC
  Administered 2018-12-26 – 2018-12-27 (×6): via INTRAVENOUS
  Filled 2018-12-26 (×8): qty 150

## 2018-12-26 MED ORDER — NOREPINEPHRINE 4 MG/250ML-% IV SOLN
INTRAVENOUS | Status: AC
  Administered 2018-12-26: 10:00:00 10 ug/min
  Filled 2018-12-26: qty 250

## 2018-12-26 MED ORDER — SODIUM CHLORIDE 0.9 % IV SOLN
2000.0000 mg | Freq: Once | INTRAVENOUS | Status: DC
  Filled 2018-12-26: qty 20

## 2018-12-26 MED ORDER — DOBUTAMINE IN D5W 4-5 MG/ML-% IV SOLN: 10.0000 ug/kg/min | INTRAVENOUS | Status: DC

## 2018-12-26 MED ORDER — SODIUM CHLORIDE 0.9 % IV SOLN: 250.0000 mL | INTRAVENOUS | Status: DC

## 2018-12-26 MED ORDER — LEVETIRACETAM 100 MG/ML PO SOLN
1500.0000 mg | Freq: Two times a day (BID) | ORAL | Status: DC
  Administered 2018-12-26 – 2018-12-27 (×2): 1500 mg
  Filled 2018-12-26 (×4): qty 15

## 2018-12-26 MED ORDER — TETANUS-DIPHTH-ACELL PERTUSSIS 5-2.5-18.5 LF-MCG/0.5 IM SUSP
0.5000 mL | Freq: Once | INTRAMUSCULAR | Status: AC
  Administered 2018-12-26: 10:00:00 0.5 mL via INTRAMUSCULAR
  Filled 2018-12-26: qty 0.5

## 2018-12-26 NOTE — ED Notes (Signed)
Dr Wilson Singer consulted ICU and cards, Critical Care MD coming to see pt.

## 2018-12-26 NOTE — Progress Notes (Signed)
Patient coded a second time at 3:35. Patient's daughters were not present bedside.  Chaplain stayed in waiting area until [place] returned.  She provided continued presence until 4 PM.   Chaplain will refer patient to incoming chaplain at 8:30 PM.

## 2018-12-26 NOTE — Procedures (Signed)
Code Blue Note  Asystolic arrest due to acidosis likely.  Epi x2, bicarb x2 and CPR to VT then shock x1.  ROSC established.  On bicarb and norepi drip now.  See code sheet for details.  Rush Farmer, M.D. [facility] Pulmonary/Critical Care Medicine. Pager: [phone]. After hours pager: [phone].

## 2018-12-26 NOTE — ED Notes (Signed)
EMT attempting manual blood pressure.

## 2018-12-26 NOTE — Progress Notes (Signed)
RT note- ABG being drawn and noted poor wave form post draw. RN noted asystole on ECG, confirmed no pulse and CPR initiated. Patient was then taken off ventilator and ventilated manually. 1457 patient was then placed back on ventilator, Dr. Nelda Marseille at bedside, with increase of vent rate to 30.

## 2018-12-26 NOTE — Progress Notes (Signed)
RT note: RT and RN transported patient from ED to [facility]. Vital signs stable through out.

## 2018-12-26 NOTE — ED Notes (Signed)
Pt taken to CT by this RN, EMT and RT. BP is high now, Levo decreased to 10. Pt is in a-fib rate between 110-130

## 2018-12-26 NOTE — Progress Notes (Signed)
Chaplain responded to Code Blue.  Chaplain provided ministry of presence and support to daughter Colletta [place] and step-daughter Janett Billow as doctor met with them post-code.  Janett Billow, who is NOK, is clear in saying that if her dad cannot live as he once did, "he would not want to be this way."  She understands that she can change his code status as his functional status becomes clearer.  Chaplain will continue to be available as needed.  Rev. Tamsen Snider Pager [phone]

## 2018-12-26 NOTE — ED Notes (Signed)
Pt's VSS, pt has had very little movement since the beginning of this visit. No purposeful movement. No movement at this time to any type of stimuli.

## 2018-12-26 NOTE — Progress Notes (Signed)
RT note: RT and RN transported patient to CT and back to ED. Vital signs stable through out.
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
## 2018-12-26 NOTE — Progress Notes (Signed)
CRITICAL VALUE ALERT  Critical Value:  Lactic acid 8.7  Date & Time Notied:  12/26/18 1738   Provider Notified: MD Nelda Marseille  Orders Received/Actions taken: Continue bicarb infusion as ordered.  ABG results also discussed with MD.  Dr. Nelda Marseille advised to proceed with Dobutamine infusion per cardiology if patient heart rate become bradycardic again.

## 2018-12-26 NOTE — ED Notes (Signed)
Critical Care PA Richardson Landry Minor in room to assess pt.

## 2018-12-26 NOTE — ED Notes (Signed)
2nd unit of blood started. BP improving.

## 2018-12-26 NOTE — Consult Note (Addendum)
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 cells are unopacified. CT MAXILLOFACIAL FINDINGS Osseous: No fracture or mandibular dislocation. No destructive process. Orbits: Negative. No traumatic or inflammatory finding. Intact globes. Sinuses: No fluid levels. Mild mucoperiosteal thickening in the ethmoidal air cells and frontal sinus. Soft tissues: Oral route tubes enter the hypopharynx with the tip not seen on this scan. CT CERVICAL SPINE FINDINGS Alignment: Straightening of the cervical spine. No facet subluxation. Dens is well positioned between the lateral masses of C1. Skull base and vertebrae: No acute fracture. No primary bone lesion or focal pathologic process. Soft tissues and spinal canal: No prevertebral edema. No visible canal hematoma. Disc levels: Status post ACDF C3-4, with no evidence of hardware fracture or loosening. 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 Advanced bilateral facet arthropathy. Severe degenerative foraminal stenosis on the left at C2-3. Upper chest: Endotracheal tube enters the trachea with the tip not seen on this scan. Enteric tube enters the thoracic esophagus with the tip not seen on this scan.  Other: Visualized mastoid air cells appear clear. No discrete thyroid nodules. No pathologically enlarged cervical nodes. IMPRESSION: 1. Left superior frontal scalp contusion with overlying skin staples. No evidence of calvarial fracture. No maxillofacial fracture. 2.  No evidence of acute intracranial abnormality. 3. Mild chronic small vessel ischemic changes in the cerebral white matter. 4. Mild chronic appearing paranasal sinusitis. 5. No cervical spine fracture or subluxation. 6. Advanced degenerative changes throughout the cervical spine as detailed. These results were called by telephone at the time of interpretation on 01/10/2019 at 11:58 am to Dr. Romana Juniper , who verbally acknowledged these results. Electronically Signed   By: Ilona Sorrel M.D.   On: 01/04/2019 12:20    Assessment and Plan:   1. Complete heart block: This could be the cause of his bradycardia which caused his arrest,  though this is not completely clear.  He is currently undergoing evaluation by neurology for potential anoxic brain injury.  He continues to have bradycardic episodes leading to asystolic arrest requiring epinephrine.  I have increased his norepinephrine drip to 20 mcg/kg/min to hopefully prevent any further episodes of bradycardia.  Should he continue to have episodes of bradycardia, dobutamine can be started.  Hopefully we can avoid placing a temporary wire while he is undergoing evaluation by neurology to see if he has any meaningful brain function as he had likely prolonged downtime and cerebral ischemia.  Should it be deemed that medical management for his complete heart block is not beneficial, would plan for temporary wire placement.   CRITICAL CARE Performed by: Miraj Truss   Total critical care time: 45 minutes   Critical care time was exclusive of separately billable procedures and treating other patients.   Critical care was necessary to treat or prevent imminent or life-threatening deterioration.    Critical care was time spent personally by me (independent of midlevel providers or residents) on the following activities: development of treatment plan with patient and/or surrogate as well as nursing, discussions with consultants, evaluation of patient's response to treatment, examination of patient, obtaining history from patient or surrogate, ordering and performing treatments and interventions, ordering and review of laboratory studies, ordering and review of radiographic studies, pulse oximetry and re-evaluation of patient's condition.  For questions or updates, please contact [place] Please consult [email or web address] for contact info under     Signed, Daaiyah Baumert Meredith Leeds, MD  12/25/2018 3:25 PM

## 2018-12-26 NOTE — ED Notes (Signed)
2nd unit of RBCs finished. BP normal.

## 2018-12-26 NOTE — Code Documentation (Signed)
  Patient Name: [name]   MRN: [id number]   Date of Birth/ Sex: 31-Jan-1946 , male      Admission Date: 01/09/2019  Attending Provider: No att. providers found  Primary Diagnosis: <principal problem not specified>   Indication: Pt was in his usual state of health until this AM, when he was noted to be bradcardia > asystole > V. Fib (monomorphic). Code blue was subsequently called. At the time of arrival on scene, ACLS protocol was underway.   Technical Description:  - CPR performance duration:  2 minutes  - Was defibrillation or cardioversion used? Yes   - Was external pacer placed? No  - Was patient intubated pre/post CPR? Yes   Medications Administered: Y = Yes; Blank = No Amiodarone    Atropine    Calcium    Epinephrine  Y  Lidocaine    Magnesium    Norepinephrine    Phenylephrine    Sodium bicarbonate  Y  Vasopressin     Post CPR evaluation:  - Final Status - Was patient successfully resuscitated ? Yes - What is current rhythm? Atrial fibrillation - What is current hemodynamic status? Supported with pressors  Miscellaneous Information:  - Labs sent, including: Troponin, ABG  - Primary team notified?  Yes   - Family Notified? Yes, at bedside  - Additional notes/ transfer status:  None     Jose Persia, MD  01/09/2019, 3:13 PM
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
## 2018-12-26 NOTE — Progress Notes (Signed)
A second cardiac arrest in the ICU.  See code note.  Brought in cardiology who suggested dopamine, if ineffective then will need a temp wire.  Brought in neurology who will perform a stat EEG.  I suspect patient is either myoclonic or seizing.  Ativan IV ordered.  Keppra load ordered.  Neurology will evaluate.  D/C MRI for now.  Spoke with the entire family, they are all in agreement that if patient is not to return back to normal he would not want to be alive but would like to know what is the cause prior to decision making.  The patient is critically ill with multiple organ systems failure and requires high complexity decision making for assessment and support, frequent evaluation and titration of therapies, application of advanced monitoring technologies and extensive interpretation of multiple databases.   Critical Care Time devoted to patient care services described in this note is  45  Minutes. This time reflects time of care of this signee Dr Jennet Maduro. This critical care time does not reflect procedure time, or teaching time or supervisory time of PA/NP/Med student/Med Resident etc but could involve care discussion time.  Rush Farmer, M.D. [facility] Pulmonary/Critical Care Medicine. Pager: [phone]. After hours pager: [phone].

## 2018-12-26 NOTE — ED Notes (Signed)
Dr Wilson Singer and Dr Kae Heller ordered 2 unit of emergency o positive blood to be given. 1st Unit started

## 2018-12-26 NOTE — Progress Notes (Signed)
LTM EEG hooked up and running - no initial skin breakdown - push button tested - neuro notified.  
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
## 2018-12-26 NOTE — ED Notes (Signed)
Pt brought back to room from CT.  

## 2018-12-26 NOTE — Progress Notes (Signed)
Abg results called to MD Nelda Marseille, new orders received.

## 2018-12-26 NOTE — Procedures (Signed)
Arterial Catheter Insertion Procedure Note [name] [id number] December 17, 1945  Procedure: Insertion of Arterial Catheter  Indications: Blood pressure monitoring and Frequent blood sampling  Procedure Details Consent: Unable to obtain consent because of emergent medical necessity. Time Out: Verified patient identification, verified procedure, site/side was marked, verified correct patient position, special equipment/implants available, medications/allergies/relevent history reviewed, required imaging and test results available.  Performed  Maximum sterile technique was used including antiseptics, cap, gloves, gown, hand hygiene, mask and sheet. Skin prep: Chlorhexidine; local anesthetic administered 20 gauge catheter was inserted into right radial artery using the Seldinger technique. ULTRASOUND GUIDANCE USED: NO Evaluation Blood flow good; BP tracing good. Complications: No apparent complications. RT placed Arrow Catheter on first attempt.   [name] 01/14/2019
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
## 2018-12-26 NOTE — Progress Notes (Signed)
CRITICAL VALUE ALERT  Critical Value:  Potassium 6.5  Date & Time Notied:  01/07/2019  At 1330  Provider Notified: MD Nelda Marseille  Orders Received/Actions taken: follow up labs ordered, Potassium 3.6

## 2018-12-26 NOTE — ED Notes (Addendum)
Pt has $883 dollars in cash, a black cell phone, a [employer] ring with a blue stone, clothing, car key and wallet with ID given to pt's daughter Domingo Madeira.
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
## 2018-12-26 NOTE — Progress Notes (Signed)
Chaplain first responded to first page at 9:11 AM. No family was present at that time.  ED bridge said they would page if needed.  Chaplain was contacted again 10:39.  Patient's daughter Colletta Maryland was in  Consult A. She is a Furniture conservator/restorer and was on campus at time of call.  Chaplain  provided ministry of support while Colletta Maryland contacted other family members.  Her father is divorced. She is next of kin, an only child and felt the burden, she said "of having no siblings." .   Coming to the consult room were her Husband Legrand Como, daughter Markus Daft, and pt's girlfriend Marlowe Kays. Chaplain offered family beverages and support.  Chaplain to continue to follow. Rev. Tamsen Snider  Pager [phone]

## 2018-12-26 NOTE — ED Notes (Signed)
Pt's daughter visited pt in room and is now going back to family room with chaplain. She has all of pt's belongings.
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
## 2018-12-26 NOTE — Procedures (Signed)
Central Venous Catheter Insertion Procedure Note TARO HIDROGO [id number] [birth year]  Procedure: Insertion of Central Venous Catheter Indications: shock  Procedure Details Consent: Unable to obtain consent because of altered level of consciousness. Time Out: Verified patient identification, verified procedure, site/side was marked, verified correct patient position, special equipment/implants available, medications/allergies/relevent history reviewed, required imaging and test results available.  Performed  Maximum sterile technique was used. Skin prep: Chlorhexidine A antimicrobial bonded/coated triple lumen catheter was placed in the right femoral vein due to emergent situation using the Seldinger technique.  Evaluation Blood flow good Complications: No apparent complications Patient did tolerate procedure well.   Chelsea A Kae Heller 12/23/2018, 10:17 AM

## 2018-12-26 NOTE — Progress Notes (Signed)
  Echocardiogram 2D Echocardiogram has been performed.  Jennette Dubin 12/29/2018, 2:39 PM

## 2018-12-26 NOTE — ED Notes (Signed)
1st unit of blood finished

## 2018-12-27 ENCOUNTER — Inpatient Hospital Stay (HOSPITAL_COMMUNITY): Payer: Medicare Other

## 2018-12-27 ENCOUNTER — Encounter (HOSPITAL_COMMUNITY): Payer: Self-pay

## 2018-12-27 DIAGNOSIS — I469 Cardiac arrest, cause unspecified: Secondary | ICD-10-CM

## 2018-12-27 LAB — POCT I-STAT 7, (LYTES, BLD GAS, ICA,H+H)
Acid-base deficit: 6 mmol/L — ABNORMAL HIGH (ref 0.0–2.0)
Bicarbonate: 16.6 mmol/L — ABNORMAL LOW (ref 20.0–28.0)
Calcium, Ion: 1.04 mmol/L — ABNORMAL LOW (ref 1.15–1.40)
HCT: 46 % (ref 39.0–52.0)
Hemoglobin: 15.6 g/dL (ref 13.0–17.0)
O2 Saturation: 99 %
Patient temperature: 38.7
Potassium: 3.9 mmol/L (ref 3.5–5.1)
Sodium: 144 mmol/L (ref 135–145)
TCO2: 17 mmol/L — ABNORMAL LOW (ref 22–32)
pCO2 arterial: 26.5 mmHg — ABNORMAL LOW (ref 32.0–48.0)
pH, Arterial: 7.411 (ref 7.350–7.450)
pO2, Arterial: 163 mmHg — ABNORMAL HIGH (ref 83.0–108.0)

## 2018-12-27 LAB — BPAM RBC
Blood Product Expiration Date: 202009022359
Blood Product Expiration Date: 202009022359
ISSUE DATE / TIME: 202008090913
ISSUE DATE / TIME: 202008090913
Unit Type and Rh: 5100
Unit Type and Rh: 5100

## 2018-12-27 LAB — CBC
HCT: 45.5 % (ref 39.0–52.0)
Hemoglobin: 15.9 g/dL (ref 13.0–17.0)
MCH: 32.9 pg (ref 26.0–34.0)
MCHC: 34.9 g/dL (ref 30.0–36.0)
MCV: 94.2 fL (ref 80.0–100.0)
Platelets: 140 10*3/uL — ABNORMAL LOW (ref 150–400)
RBC: 4.83 MIL/uL (ref 4.22–5.81)
RDW: 13.4 % (ref 11.5–15.5)
WBC: 20.6 10*3/uL — ABNORMAL HIGH (ref 4.0–10.5)
nRBC: 0 % (ref 0.0–0.2)

## 2018-12-27 LAB — TYPE AND SCREEN
ABO/RH(D): O POS
Antibody Screen: NEGATIVE
Unit division: 0
Unit division: 0

## 2018-12-27 LAB — URINE CULTURE: Culture: NO GROWTH

## 2018-12-27 LAB — BASIC METABOLIC PANEL
Anion gap: 15 (ref 5–15)
BUN: 14 mg/dL (ref 8–23)
CO2: 18 mmol/L — ABNORMAL LOW (ref 22–32)
Calcium: 7.6 mg/dL — ABNORMAL LOW (ref 8.9–10.3)
Chloride: 110 mmol/L (ref 98–111)
Creatinine, Ser: 1.11 mg/dL (ref 0.61–1.24)
GFR calc Af Amer: >60 mL/min (ref >60)
GFR calc non Af Amer: >60 mL/min (ref >60)
Glucose, Bld: 374 mg/dL — ABNORMAL HIGH (ref 70–99)
Potassium: 3.9 mmol/L (ref 3.5–5.1)
Sodium: 143 mmol/L (ref 135–145)

## 2018-12-27 LAB — GLUCOSE, CAPILLARY
Glucose-Capillary: 327 mg/dL — ABNORMAL HIGH (ref 70–99)
Glucose-Capillary: 340 mg/dL — ABNORMAL HIGH (ref 70–99)
Glucose-Capillary: 334 mg/dL — ABNORMAL HIGH (ref 70–99)
Glucose-Capillary: 291 mg/dL — ABNORMAL HIGH (ref 70–99)
Glucose-Capillary: 276 mg/dL — ABNORMAL HIGH (ref 70–99)
Glucose-Capillary: 251 mg/dL — ABNORMAL HIGH (ref 70–99)

## 2018-12-27 MED ORDER — SODIUM CHLORIDE 0.9% FLUSH: 10.0000 mL | INTRAVENOUS | Status: DC | PRN

## 2018-12-27 MED ORDER — GLYCOPYRROLATE 1 MG PO TABS
1.0000 mg | ORAL_TABLET | ORAL | Status: DC | PRN
  Filled 2018-12-27: qty 1

## 2018-12-27 MED ORDER — FENTANYL CITRATE (PF) 100 MCG/2ML IJ SOLN
50.0000 ug | INTRAMUSCULAR | Status: DC | PRN
  Administered 2018-12-27 (×2): 100 ug via INTRAVENOUS
  Filled 2018-12-27 (×2): qty 2

## 2018-12-27 MED ORDER — GLYCOPYRROLATE 0.2 MG/ML IJ SOLN
0.2000 mg | INTRAMUSCULAR | Status: DC | PRN
  Administered 2018-12-27: 0.2 mg via INTRAVENOUS
  Filled 2018-12-27: qty 1

## 2018-12-27 MED ORDER — DEXTROSE 5 % IV SOLN: INTRAVENOUS | Status: DC

## 2018-12-27 MED ORDER — SODIUM CHLORIDE 0.9% FLUSH
10.0000 mL | Freq: Two times a day (BID) | INTRAVENOUS | Status: DC
  Administered 2018-12-27: 10 mL

## 2018-12-27 MED ORDER — IBUPROFEN 100 MG/5ML PO SUSP
400.0000 mg | Freq: Once | ORAL | Status: AC
  Administered 2018-12-27: 400 mg
  Filled 2018-12-27: qty 20

## 2018-12-27 MED ORDER — DIPHENHYDRAMINE HCL 50 MG/ML IJ SOLN: 25.0000 mg | INTRAMUSCULAR | Status: DC | PRN

## 2018-12-27 MED ORDER — CHLORHEXIDINE GLUCONATE CLOTH 2 % EX PADS: 6.0000 | MEDICATED_PAD | Freq: Every day | CUTANEOUS | Status: DC

## 2018-12-27 MED ORDER — POLYVINYL ALCOHOL 1.4 % OP SOLN
1.0000 [drp] | Freq: Four times a day (QID) | OPHTHALMIC | Status: DC | PRN
  Filled 2018-12-27: qty 15

## 2018-12-27 MED ORDER — CHLORHEXIDINE GLUCONATE CLOTH 2 % EX PADS
6.0000 | MEDICATED_PAD | Freq: Every day | CUTANEOUS | Status: DC
  Administered 2018-12-27: 12:00:00 6 via TOPICAL

## 2018-12-27 MED ORDER — ACETAMINOPHEN 325 MG PO TABS: 650.0000 mg | ORAL_TABLET | Freq: Four times a day (QID) | ORAL | Status: DC | PRN

## 2018-12-27 MED ORDER — GLYCOPYRROLATE 0.2 MG/ML IJ SOLN: 0.2000 mg | INTRAMUSCULAR | Status: DC | PRN

## 2018-12-27 MED ORDER — HEPARIN SODIUM (PORCINE) 5000 UNIT/ML IJ SOLN
5000.0000 [IU] | Freq: Three times a day (TID) | INTRAMUSCULAR | Status: DC
  Administered 2018-12-27: 5000 [IU] via SUBCUTANEOUS
  Filled 2018-12-27: qty 1

## 2018-12-27 MED ORDER — INSULIN REGULAR(HUMAN) IN NACL 100-0.9 UT/100ML-% IV SOLN
INTRAVENOUS | Status: DC
  Administered 2018-12-27: 11.5 [IU]/h via INTRAVENOUS
  Administered 2018-12-27: 2.7 [IU]/h via INTRAVENOUS
  Filled 2018-12-27 (×2): qty 100

## 2018-12-27 MED ORDER — SODIUM BICARBONATE-DEXTROSE 150-5 MEQ/L-% IV SOLN
150.0000 meq | INTRAVENOUS | Status: DC
  Filled 2018-12-27 (×3): qty 1000

## 2018-12-27 MED ORDER — ACETAMINOPHEN 650 MG RE SUPP: 650.0000 mg | Freq: Four times a day (QID) | RECTAL | Status: DC | PRN

## 2018-12-27 MED FILL — Medication: Qty: 1 | Status: AC

## 2018-12-28 ENCOUNTER — Telehealth: Payer: Self-pay | Admitting: Pulmonary Disease

## 2018-12-28 LAB — CULTURE, RESPIRATORY W GRAM STAIN: Culture: NORMAL

## 2018-12-28 NOTE — Telephone Encounter (Signed)
12/28/2018 I received a D/C from [employer] home for Dr. Lynetta Mare will send it to him at [facility]. PWR  12/31/2018 Received Signed D/C back. Dr. Lynetta Mare not in we had Ernest Mallick, DO to sign for him since it was a Designer, fashion/clothing. She was very kind to do that for [place]. PWR

## 2018-12-29 ENCOUNTER — Encounter: Payer: Self-pay | Admitting: Podiatry

## 2018-12-31 LAB — CULTURE, BLOOD (ROUTINE X 2)
Culture: NO GROWTH
Culture: NO GROWTH
Special Requests: ADEQUATE
Special Requests: ADEQUATE

## 2019-01-05 ENCOUNTER — Telehealth: Payer: Self-pay | Admitting: Critical Care Medicine
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
## 2019-01-10 ENCOUNTER — Telehealth: Payer: Self-pay | Admitting: *Deleted

## 2019-01-10 NOTE — Telephone Encounter (Signed)
Received original D/C funeral home from [facility], D/C resigned and mailed to [facility] as requested.

## 2019-01-18 NOTE — Death Summary Note (Signed)
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 epidemiological  and / or clinical management purposes  to differentiate between  SARS-CoV-2 and other Sarbecovirus currently known to infect humans.  If clinically indicated additional testing with an alternate test  methodology [phone]) is advised. The SARS-CoV-2 RNA is generally  detectable in upper and lower respiratory sp ecimens during the acute  phase of infection. The expected result is Negative. Fact Sheet for Patients:  StrictlyIdeas.no Fact Sheet for Healthcare Providers: BankingDealers.co.za This test is not yet approved or cleared by the [place] FDA  and has been authorized for detection and/or diagnosis of SARS-CoV-2 by FDA under an Emergency Use Authorization (EUA).  This EUA will remain in effect (meaning this test can be used) for the duration of the COVID-19 declaration under Section 564(b)(1) of the Act, 21 U.S.C. section 360bbb-3(b)(1), unless the authorization is terminated or revoked sooner. Performed at [facility], [place] [address] [place], [place] [address]   Urine culture     Status: None   Collection Time: 01/06/2019  1:00 PM   Specimen: Urine, Random  Result Value Ref Range Status   Specimen Description URINE, RANDOM  Final   Special Requests NONE  Final   Culture    Final    NO GROWTH Performed at [facility], [place] [address], [place], [place] [address]    Report Status 01/06/19 FINAL  Final  Culture, blood (routine x 2)     Status: None   Collection Time: 01/12/2019  1:27 PM   Specimen: BLOOD  Result Value Ref Range Status   Specimen Description BLOOD SITE NOT SPECIFIED  Final   Special Requests   Final    BOTTLES DRAWN AEROBIC AND ANAEROBIC Blood Culture adequate volume   Culture   Final    NO GROWTH 5 DAYS Performed at [facility], [place] [address] [place], [place] [address]    Report Status 12/31/2018 FINAL  Final  MRSA PCR Screening     Status: None   Collection Time: 12/25/2018  2:20 PM   Specimen: Nasopharyngeal  Result Value Ref Range Status   MRSA by PCR NEGATIVE NEGATIVE Final    Comment:        The GeneXpert MRSA Assay (FDA approved for NASAL specimens only), is one component of a comprehensive MRSA colonization surveillance program. It is not intended to diagnose MRSA infection nor to guide or monitor treatment for MRSA infections. Performed at [facility], [place] [address], [place], [place] [address]   Culture, blood (routine x 2)     Status: None   Collection Time: 01/03/2019  3:59 PM   Specimen: BLOOD LEFT HAND  Result Value Ref Range Status   Specimen Description BLOOD LEFT HAND  Final   Special Requests   Final    BOTTLES DRAWN AEROBIC AND ANAEROBIC Blood Culture adequate volume   Culture   Final    NO GROWTH 5 DAYS Performed at [facility], [place] [address], [place], [place] [address]    Report Status 12/31/2018 FINAL  Final    Lab Basic Metabolic Panel: Recent Labs  Lab 12/23/2018 0930 01/03/2019 0940  01/06/2019 1300  01/11/2019 1458 01/01/2019 1608 12/31/2018 1726 01-06-2019 0430 01/06/2019 0442  NA 138 136   < > 134*   < > 141 142 141 143 144  K 3.3* 3.2*   < > 6.5*   < > 3.6 3.3* 3.7 3.9 3.9  CL 98 100  --  106  --  108  --   --  110  --   CO2 18*  --   --  16*  --  17*  --   --  18*  --    GLUCOSE 398* 387*  --  367*  --  370*  --   --  374*  --   BUN 18 20  --  18  --  21  --   --  14  --   CREATININE 1.29* 1.10  --  1.11  --  1.14  --   --  1.11  --   CALCIUM 9.6  --   --  7.9*  --  7.5*  --   --  7.6*  --   MG  --   --   --  1.6*  --   --   --   --   --   --   PHOS  --   --   --  3.5  --   --   --   --   --   --    < > = values in this interval not displayed.   Liver Function Tests: Recent Labs  Lab 12/20/2018 0930 12/19/2018 1300  AST 132* 212*  ALT 118* 160*  ALKPHOS 73 80  BILITOT 1.0 0.9  PROT 6.1* 6.1*  ALBUMIN 3.7 3.7   Recent Labs  Lab 01/10/2019 1300  LIPASE 63*  AMYLASE 105*   No results for input(s): AMMONIA in the last 168 hours. CBC: Recent Labs  Lab 12/31/2018 0930  01/08/2019 1300  01/10/2019 1522 12/29/2018 1608 01/09/2019 1726 2019/01/14 0430 January 14, 2019 0442  WBC 11.0*  --  21.1*  --  26.2*  --   --  20.6*  --   NEUTROABS  --   --  17.3*  --   --   --   --   --   --   HGB 15.2   < > 16.9   < > 16.5 16.3 16.7 15.9 15.6  HCT 47.9   < > 49.5   < > 48.3 48.0 49.0 45.5 46.0  MCV 105.3*  --  95.2  --  95.5  --   --  94.2  --   PLT 161  --  164  --  141*  --   --  140*  --    < > = values in this interval not displayed.   Cardiac Enzymes: No results for input(s): CKTOTAL, CKMB, CKMBINDEX, TROPONINI in the last 168 hours. Sepsis Labs: Recent Labs  Lab 01/05/2019 0930 01/12/2019 0933 12/25/2018 1300 01/14/2019 1522 12/31/2018 1559 12/26/2018 2115 January 14, 2019 0430  PROCALCITON  --   --  0.33  --   --   --   --   WBC 11.0*  --  21.1* 26.2*  --   --  20.6*  LATICACIDVEN  --  >11.0* 4.6*  --  8.7* 8.4*  --     Procedures/Operations  Mechanical ventilation and EEG.   Alger Kerstein 01/01/2019, 4:57 PM

## 2019-01-18 NOTE — Procedures (Addendum)
Patient Name: [name]  MRN: [id number]  Epilepsy Attending: Lora Havens  Referring Physician/Provider: Dr Roland Rack Duration: 01/12/2019 1549 to 21-Jan-2019 0910  Patient history: 73yo M with cardiac arrest. EEG ordered to evaluate for seizures  Level of alertness: comatose  Technical aspects: This EEG study was done with scalp electrodes positioned according to the 10-20 International system of electrode placement. Electrical activity was acquired at a sampling rate of 500Hz  and reviewed with a high frequency filter of 70Hz  and a low frequency filter of 1Hz . EEG data were recorded continuously and digitally stored.   DESCRIPTION: EEG showed diffuse background suppression.  Multiple episodes of sudden eye opening were captured.  Concomitant EEG showed generalized polyspikes consistent with myoclonic seizure.  After around 1 AM, EEG showed brief ictal-interictal rhythmic discharges, at times asynchronous, lasting 2 to 8 seconds. Hyperventilation and photic stimulation were not performed.   IMPRESSION: This study showed evidence of generalized epileptogenicity as well as profound diffuse encephalopathy.  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  Treating provider was notified           Lora Havens
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
## 2019-01-18 NOTE — Progress Notes (Signed)
LTM EEG discontinued - no skin breakdown at unhook.   
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
## 2019-01-18 NOTE — Progress Notes (Signed)
Nutrition Brief Note  Chart reviewed. Pt with diffuse anoxic injury with cerebral edema and bilateral uncal herniation. Pt's daughter is making arrangements for family to come in for one-way extubation. No nutrition interventions warranted at this time.  Please consult as needed.    Gaynell Face, MS, RD, LDN Inpatient Clinical Dietitian Pager: [phone] Weekend/After Hours: [phone]

## 2019-01-18 NOTE — Plan of Care (Signed)
Plan of care very fluid presently ongoing

## 2019-01-18 NOTE — Progress Notes (Signed)
Critical Care Attending Addendum:  Personally reviewed MRI performed today which shows diffuse anoxic injury with cerebral edema and bilateral uncal herniation. Further discussed with Dr Rory Percy who concurs that MRI indicates poor prognosis.   Have discussed results with the patient's daughter who is now making arrangements for family to come in for compassionate extubation.  She agrees to no escalation of care or CPR if patient's condition should deteriorate.  CRITICAL CARE Performed by: Kipp Brood  Additional critical care time: 30 minutes  Critical care time was exclusive of separately billable procedures and treating other patients.  Critical care was necessary to treat or prevent imminent or life-threatening deterioration.  Critical care was time spent personally by me on the following activities: development of treatment plan with patient and/or surrogate as well as nursing, discussions with consultants, evaluation of patient's response to treatment, examination of patient, obtaining history from patient or surrogate, ordering and performing treatments and interventions, ordering and review of laboratory studies, ordering and review of radiographic studies, pulse oximetry, re-evaluation of patient's condition and participation in multidisciplinary rounds.  Kipp Brood, MD [facility] ICU Physician [place]  Pager: [phone] Mobile: [phone] After hours: [phone].
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
## 2019-01-18 NOTE — Procedures (Signed)
Extubation Procedure Note  Patient Details:   Name: [name] DOB: Feb 14, 1946 MRN: [id number]   Airway Documentation:    Vent end date: 01-12-2019 Vent end time: 1726   Evaluation  O2 sats: stable throughout Complications: No apparent complications Patient did tolerate procedure well. Bilateral Breath Sounds: Diminished, Rhonchi   No   Patient was extubated to room air per end of life withdrawal order. Daughters and RN at the bedside with RT during extubation.  Tiburcio Bash January 12, 2019, 5:29 PM

## 2019-01-18 NOTE — Progress Notes (Signed)
MRI completed and reviewed. Severe anoxic brain injury and tonsillar herniation.  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 Generalized brain swelling. Cerebellar tonsils protrude through the foramen magnum. Duret hemorrhages of the brainstem.   Given the poor clinical exam, poor initial EEG along with LTM EEG that only showed myoclonic seizures and MRI showing severe anoxic injury with tonsillar herniation, prognosis for a neurological meaningful recovery will likely be dismal.   Dr. Lynetta Mare will discuss comfort measures/one way extubation/withdrawal options as daughter had wanted to discuss that should MRI confirm anoxic injury.  -- Amie Portland, MD [facility] Pager: [phone] If 7pm to 7am, please call on call as listed on [facility].

## 2019-01-18 NOTE — Progress Notes (Signed)
I responded to a page from the nurse to provide spiritual support for the patient's family. I visited the patient's room with is daughters present. I shared words of encouragement and comfort and led in prayer. I shared that the Chaplain is available for additional support as needed or requested.    2019-01-09 1629  Clinical Encounter Type  Visited With Patient and family together  Visit Type Spiritual support  Referral From Nurse  Consult/Referral To Chaplain  Spiritual Encounters  Spiritual Needs Prayer;Emotional    Chaplain Dr Redgie Grayer

## 2019-01-18 NOTE — Progress Notes (Signed)
eLink Physician-Brief Progress Note Patient Name: [name] DOB: 1945/07/17 MRN: [id number]   Date of Service  11-Jan-2019  HPI/Events of Note  Fever - Temp to 100.0 F. Request for Tylenol. AST = 212 and ALT = 160. Therefore, can't use Tylenol. Creatinine = 1.14. Blood cultures X2 and tracheal aspirate cultures are pending from 12/19/2018. PCT = 0.33. CXR without obvious infiltrate.   eICU Interventions  Will order: 1. Motrin 400 mg per tube X 1 if Temp > 100.5 F.        Elisse Pennick [place] 11-Jan-2019, 12:21 AM
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
## 2019-01-18 DEATH — deceased

## 2019-03-20 NOTE — Progress Notes (Signed)
Entered chart for [employer]
